# Patient Record
Sex: Male | Born: 1960 | Race: White | Hispanic: No | Marital: Married | Smoking: Never smoker
Health system: Southern US, Community
[De-identification: ages and names within clinical notes are randomized; demographics above are authoritative.]

## PROBLEM LIST (undated history)

## (undated) DIAGNOSIS — J302 Other seasonal allergic rhinitis: Secondary | ICD-10-CM

## (undated) DIAGNOSIS — N289 Disorder of kidney and ureter, unspecified: Secondary | ICD-10-CM

## (undated) HISTORY — DX: Other seasonal allergic rhinitis: J30.2

---

## 2004-04-05 ENCOUNTER — Emergency Department (HOSPITAL_COMMUNITY): Admission: EM | Admit: 2004-04-05 | Discharge: 2004-04-05 | Payer: Self-pay | Admitting: Emergency Medicine

## 2015-02-04 ENCOUNTER — Ambulatory Visit (INDEPENDENT_AMBULATORY_CARE_PROVIDER_SITE_OTHER): Payer: 59 | Admitting: Sports Medicine

## 2015-02-04 ENCOUNTER — Encounter: Payer: Self-pay | Admitting: Sports Medicine

## 2015-02-04 VITALS — BP 141/88 | HR 59 | Ht 65.0 in | Wt 140.0 lb

## 2015-02-04 DIAGNOSIS — M67952 Unspecified disorder of synovium and tendon, left thigh: Secondary | ICD-10-CM | POA: Insufficient documentation

## 2015-02-04 NOTE — Assessment & Plan Note (Signed)
This is mild so we will focus on Hip abduction series to retrain glut med  Use topical creams  OK to train  His running gait shows increased "wobble" on foot strike seen at left hip  Work his strngth for 6 to 8 weeks Reck and repeat scan and gait eval.

## 2015-02-04 NOTE — Progress Notes (Signed)
Patient ID: Tyler KellyBruce Mccoy, male   DOB: [birth year], 54 y.o.   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  Over past 3 mos has been treated for persistent left lat hip pain Felt to be buristis  Rest and cross training help NSAIDs help  STill able to train but sometimes hurts 24 to 48 hours later  He thinks it flares more on bike than w running  Exam Muscular M/ ND BP 141/88 mmHg  Pulse 59  Ht 5\' 5"  (1.651 m)  Wt 140 lb (63.504 kg)  BMI 23.30 kg/m2  Hips - Full ROM bilat LT hip some TTP along glut med most closer to ILiac crest Weakness on hip abduction on left only Other muslces all very strong Feel tenderness on lat and xover step up  US OF LT lat hip Grt troch shows no sign of bursal swelling Glut med and piriformis tendonsintact Prox glut med shows hyperechoic change 2 cms distal to iliac crest No sign of tear  No hypoechoic change Hyperechoic change on trans scan ~ 20% of glut med mm and only in proximal 2 cms

## 2015-03-24 ENCOUNTER — Encounter: Payer: Self-pay | Admitting: Sports Medicine

## 2015-03-24 ENCOUNTER — Ambulatory Visit (INDEPENDENT_AMBULATORY_CARE_PROVIDER_SITE_OTHER): Payer: 59 | Admitting: Sports Medicine

## 2015-03-24 VITALS — BP 120/82 | Ht 65.0 in | Wt 140.0 lb

## 2015-03-24 DIAGNOSIS — M67952 Unspecified disorder of synovium and tendon, left thigh: Secondary | ICD-10-CM | POA: Diagnosis not present

## 2015-03-24 NOTE — Progress Notes (Signed)
  Tyler Mccoy - 54 y.o. 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 He is training for the [employer] in [place], [place] in [place].  - Over past 3 mos has been treated for persistent left lat hip pain - This was initially felt to be bursitis - Rest and cross training help - NSAIDs also help help - He is still able to train but sometimes hurts 24 to 48 hours later - He aggravates the pain mostly when biking in larger gears with low RPMs   - He has been doing his Rx'd exercises almost daily: abduction, squats, flexion/abduction - He currently only in pain in morning while sitting.  - He  feels much better after stretching - no pain with exercise - heating does help with pain   ROS:     Negative other than that in HPI  HISTORY: Past Medical, Surgical, Social, and Family History Reviewed & Updated per EMR.  Pertinent Historical Findings include: negative  OBJECTIVE: BP 120/82 mmHg  Ht  (1.651 m)  Wt 140 lb (63.504 kg)  BMI 23.30 kg/m2  Physical Exam  Gen: NAD Resp: non-labored breathing.  Hips - Full ROM bilat LT hip with some TTP in mid-body of glut med most closer to ILiac crest Weakness on hip abduction L>R, although greatly improved.  Other muslces all very strong Stable hips with running gait   MEDICATIONS, LABS & OTHER ORDERS: Previous Medications   ASCORBIC ACID (VITAMIN C) 100 MG TABLET    Take 100 mg by mouth daily.   MULTIPLE VITAMIN (MULTIVITAMIN) CAPSULE    Take 1 capsule by mouth daily.   OMEGA-3 ACID ETHYL ESTERS (LOVAZA) 1 G CAPSULE    Take 1 g by mouth 2 (two) times daily.   Modified Medications   No medications on file   New Prescriptions   No medications on file   Discontinued Medications   No medications on file  No orders of the defined types were placed in this encounter.     ASSESSMENT & PLAN: See problem based charting & AVS for pt instructions.

## 2015-03-25 NOTE — Assessment & Plan Note (Addendum)
Doing very well after doing almost daily hip stabilizing exercises.  He has minimal pain in the morning now. - He will continue to work on hip stabilizing exercises - f/u PRN.

## 2017-09-24 ENCOUNTER — Other Ambulatory Visit: Payer: Self-pay | Admitting: Allergy and Immunology

## 2017-09-24 ENCOUNTER — Ambulatory Visit
Admission: RE | Admit: 2017-09-24 | Discharge: 2017-09-24 | Disposition: A | Discharge status: Home / Self Care | Payer: 59 | Source: Ambulatory Visit | Attending: Allergy and Immunology | Admitting: Allergy and Immunology

## 2017-09-24 DIAGNOSIS — R05 Cough: Secondary | ICD-10-CM

## 2017-09-24 DIAGNOSIS — R059 Cough, unspecified: Secondary | ICD-10-CM

## 2017-10-23 ENCOUNTER — Ambulatory Visit (INDEPENDENT_AMBULATORY_CARE_PROVIDER_SITE_OTHER): Payer: 59 | Admitting: Pulmonary Disease

## 2017-10-23 ENCOUNTER — Encounter: Payer: Self-pay | Admitting: Pulmonary Disease

## 2017-10-23 VITALS — BP 138/76 | HR 70 | Ht 64.5 in | Wt 146.0 lb

## 2017-10-23 DIAGNOSIS — R05 Cough: Secondary | ICD-10-CM | POA: Diagnosis not present

## 2017-10-23 DIAGNOSIS — R059 Cough, unspecified: Secondary | ICD-10-CM

## 2017-10-23 DIAGNOSIS — J4540 Moderate persistent asthma, uncomplicated: Secondary | ICD-10-CM

## 2017-10-23 NOTE — Patient Instructions (Signed)
For persistent cough with mucus production: We will check a lung function test and high-resolution CT scan of the chest to look for a condition called bronchiectasis  For upper airway cough Here I am referring to the cough that is exacerbated by laughing.  I think this is due to upper airway irritation and he need to try to rest your vocal cords. You need to try to suppress your cough to allow your larynx (voice box) to heal.  For three days don't talk, laugh, sing, or clear your throat. Do everything you can to suppress the cough during this time. Use hard candies (sugarless Jolly Ranchers) or non-mint or non-menthol containing cough drops during this time to soothe your throat.  Use a cough suppressant (Delsym or what I have prescribed you) around the clock during this time.  After three days, gradually increase the use of your voice and back off on the cough suppressants.  For allergic asthma: Continue Symbicort, Singulair, Zyrtec, immunotherapy as directed by Dr. Irena CordsVan Winkle Please let me know if your primary care physician checked an eosinophil count recently  We will see you back in about 2 weeks to go over the results of the lung function test and CT scan

## 2017-10-23 NOTE — Progress Notes (Signed)
Subjective:   PATIENT ID: Tyler Mccoy GENDER: male DOB: 06/23/1961, MRN: [id number]  Synopsis: Referred in 10/2017 for recurrent bronchitis, wheezing and coughing.  He has allergy induced asthma and has been treated with cedar and tree pollen immunotherapy since 03/2016 by Dr. Irena Cords.   HPI  Chief Complaint  Patient presents with  . Consult    self referral for exercise induced cough X2 mos    Tyler Mccoy is here to see me for problems with his left lung: > 3 months ago he had a cold which required antibiotics and steroids > about 2 weeks later he still had symptoms so he went back to his PCP and needed prednisone > the prednisone would help for a while but then he had symptoms again  On his annual visit with allergy he had an abnormal lung exam. A CXR was normal and he was treated with an antibiotic.  He says that exercise will continue to make him clear out mucus.  He says that if he laughs he coughs.  The mucus was dark yellow, but lately it has been clear.  He will still have a harsh cough after laughing that leads to a coughing spasm that lasts for a while.  He will still have a "gurgle" in his voice and hears mucus in his throat.  If he lay on his left side he will start to wheeze.   Two years ago cleaned crawl space and was cleaning out mold with a bleach-containing solution.  He says that he did this over the course of 2 hours and had to leave the crawl space several times because the fumes became overwhelming for him.  This preceded his diagnosis of asthma.  He has allergy induced asthma and he takes Zyrtec daily, symbicort and Symbicort.  Dr. Irena Cords increased the dose to 160 a few weeks back.  This has helped with his wheezing.  He is now taking singulair.     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  He had pollen allergies.  He has been treated with immunotherapy which is helping his sinus congestion.  He doesn't have any sinus  congestion right now.    He has no family members with lung problems other than a son with asthma.    Past Medical History:  Diagnosis Date  . Seasonal allergies      Family History  Problem Relation Age of Onset  . Asthma Son      Social History   Socioeconomic History  . Marital status: Married    Spouse name: Not on file  . Number of children: Not on file  . Years of education: Not on file  . Highest education level: Not on file  Social Needs  . Financial resource strain: Not on file  . Food insecurity - worry: Not on file  . Food insecurity - inability: Not on file  . Transportation needs - medical: Not on file  . Transportation needs - non-medical: Not on file  Occupational History  . Not on file  Tobacco Use  . Smoking status: Never Smoker  . Smokeless tobacco: Never Used  Substance and Sexual Activity  . Alcohol use: Not on file  . Drug use: Not on file  . Sexual activity: Not on file  Other Topics Concern  . Not on file  Social History Narrative  . Not on file     No Known Allergies   Outpatient Medications  Prior to Visit  Medication Sig Dispense Refill  . albuterol (PROVENTIL HFA;VENTOLIN HFA) 108 (90 Base) MCG/ACT inhaler Inhale 1-2 puffs into the lungs every 6 (six) hours as needed for wheezing or shortness of breath.    . Ascorbic Acid (VITAMIN C) 100 MG tablet Take 100 mg by mouth daily.    . budesonide-formoterol (SYMBICORT) 160-4.5 MCG/ACT inhaler Inhale 2 puffs into the lungs 2 (two) times daily.    . cetirizine (ZYRTEC) 10 MG tablet Take 10 mg by mouth daily.    . Multiple Vitamin (MULTIVITAMIN) capsule Take 1 capsule by mouth daily.    Marland Kitchen. omega-3 acid ethyl esters (LOVAZA) 1 G capsule Take 1 g by mouth 2 (two) times daily.     No facility-administered medications prior to visit.     Review of Systems  Constitutional: Negative for fever and weight loss.  HENT: Positive for congestion. Negative for ear pain, nosebleeds and sore throat.     Eyes: Negative for redness.  Respiratory: Positive for cough and wheezing. Negative for shortness of breath.   Cardiovascular: Negative for palpitations, leg swelling and PND.  Gastrointestinal: Negative for nausea and vomiting.  Genitourinary: Negative for dysuria.  Skin: Negative for rash.  Neurological: Negative for headaches.  Endo/Heme/Allergies: Does not bruise/bleed easily.  Psychiatric/Behavioral: Negative for depression. The patient is not nervous/anxious.       Objective:  Physical Exam   Vitals:   10/23/17 0942  BP: 138/76  Pulse: 70  SpO2: 97%  Weight: 146 lb (66.2 kg)  Height: 5' 4.5" (1.638 m)    Gen: well appearing, no acute distress HENT: NCAT, OP clear, neck supple without masses Eyes: PERRL, EOMi Lymph: no cervical lymphadenopathy PULM: Few crackles anteriorly LUL B CV: RRR, no mgr, no JVD GI: BS+, soft, nontender, no hsm Derm: no rash or skin breakdown MSK: normal bulk and tone Neuro: A&Ox4, CN II-XII intact, strength 5/5 in all 4 extremities Psyche: normal mood and affect   CBC No results found for: WBC, RBC, HGB, HCT, PLT, MCV, MCH, MCHC, RDW, LYMPHSABS, MONOABS, EOSABS, BASOSABS   Chest imaging:  PFT:  Labs:  Path:  Echo:  Heart Catheterization:       Assessment & Plan:   Moderate persistent extrinsic asthma without complication  Cough - Plan: CT Chest High Resolution, Pulmonary function test  Discussion: Emersen comes to see me today for a persistent cough and mucus production after an episode of bronchitis.  He has baseline allergic asthma.  I do question whether or not he could have had some component of reactive airways disease as he describes significant bleach exposure 2 years ago preceding his diagnosis of asthma.  That being said, I think the management of his asthma has been appropriate up until this point.  I like to know if his serum eosinophil count has been elevated recently so he is going to check in this for  me.  I think about his problem in 2 ways.  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  I think with voice rest we can help his upper airway heal and reduce the repetitive/cyclical cough.  Second, he is producing mucus he exercises.  Given the recurrent episode of left-sided chest discomfort I worry about focal bronchiectasis.  Plan: For persistent cough with mucus production: We will check a lung function test and high-resolution CT scan of the chest to look  for a condition called bronchiectasis  For upper airway cough Here I am referring to the cough that is exacerbated by laughing.  I think this is due to upper airway irritation and he need to try to rest your vocal cords. You need to try to suppress your cough to allow your larynx (voice box) to heal.  For three days don't talk, laugh, sing, or clear your throat. Do everything you can to suppress the cough during this time. Use hard candies (sugarless Jolly Ranchers) or non-mint or non-menthol containing cough drops during this time to soothe your throat.  Use a cough suppressant (Delsym or what I have prescribed you) around the clock during this time.  After three days, gradually increase the use of your voice and back off on the cough suppressants.  For allergic asthma: Continue Symbicort, Singulair, Zyrtec, immunotherapy as directed by Dr. Irena Cords Please let me know if your primary care physician checked an eosinophil count recently  We will see you back in about 2 weeks to go over the results of the lung function test and CT scan   Current Outpatient Medications:  .  albuterol (PROVENTIL HFA;VENTOLIN HFA) 108 (90 Base) MCG/ACT inhaler, Inhale 1-2 puffs into the lungs every 6 (six) hours as needed for wheezing or shortness of breath., Disp: , Rfl:  .  Ascorbic Acid (VITAMIN C) 100 MG tablet, Take 100 mg by mouth daily.,  Disp: , Rfl:  .  budesonide-formoterol (SYMBICORT) 160-4.5 MCG/ACT inhaler, Inhale 2 puffs into the lungs 2 (two) times daily., Disp: , Rfl:  .  cetirizine (ZYRTEC) 10 MG tablet, Take 10 mg by mouth daily., Disp: , Rfl:  .  Multiple Vitamin (MULTIVITAMIN) capsule, Take 1 capsule by mouth daily., Disp: , Rfl:  .  omega-3 acid ethyl esters (LOVAZA) 1 G capsule, Take 1 g by mouth 2 (two) times daily., Disp: , Rfl:

## 2017-10-25 ENCOUNTER — Ambulatory Visit (INDEPENDENT_AMBULATORY_CARE_PROVIDER_SITE_OTHER)
Admission: RE | Admit: 2017-10-25 | Discharge: 2017-10-25 | Disposition: A | Discharge status: Home / Self Care | Payer: 59 | Source: Ambulatory Visit | Attending: Pulmonary Disease | Admitting: Pulmonary Disease

## 2017-10-25 ENCOUNTER — Telehealth: Payer: Self-pay | Admitting: Pulmonary Disease

## 2017-10-25 ENCOUNTER — Inpatient Hospital Stay: Admission: RE | Admit: 2017-10-25 | Payer: 59 | Source: Ambulatory Visit

## 2017-10-25 DIAGNOSIS — J4540 Moderate persistent asthma, uncomplicated: Secondary | ICD-10-CM

## 2017-10-25 DIAGNOSIS — R059 Cough, unspecified: Secondary | ICD-10-CM

## 2017-10-25 DIAGNOSIS — R05 Cough: Secondary | ICD-10-CM

## 2017-10-25 NOTE — Telephone Encounter (Signed)
Working on a appeal Tobe SosSally E Ottinger

## 2017-10-25 NOTE — Telephone Encounter (Signed)
Sp[facility] with [facility] who stated she had just spoken with pt about the CT scan.  [facility] to our office stating why the CT scan was denied, and it looked like pt needed a current cxr.  Almyra FreeLibby was able to call pt back to see if he could come for a cxr and pt stated to her he could.  Order has been placed for cxr to be done so that way we can see if we can get ct scan approved.  Will route this message to [facility] for her to follow-up on regarding status of approval for pt's CT.

## 2017-10-31 NOTE — Telephone Encounter (Signed)
Patient had a cxr on 3/21 and results have been relayed to patient.  Libby please advise if this helps this appeal process for the CT.  Thanks!

## 2017-10-31 NOTE — Telephone Encounter (Signed)
PCCs, please advise if anything else is needed for this message. Thanks!

## 2017-11-06 NOTE — Telephone Encounter (Signed)
ATC pt, no answer. Left message for pt to call back with intepreter.

## 2017-11-06 NOTE — Telephone Encounter (Signed)
[facility]ed [facility] and made an appt for 11/08/2017 at 12:30 for peer to peer.

## 2017-11-06 NOTE — Telephone Encounter (Signed)
Can you call the peer to peer line and find out if I need to schedule it?  Lately when I call they tell me that I can talk to a physician at somepoint in the future.

## 2017-11-06 NOTE — Telephone Encounter (Signed)
Well I went in the ins website and sent more records and they have now denied the reconsideration so this pt can no have this chest cone now if dr Kendrick Friesmcquaid wants to call and try a peer to peer case# that was denied [id number] # [phone] thanks libby

## 2017-11-08 ENCOUNTER — Ambulatory Visit (INDEPENDENT_AMBULATORY_CARE_PROVIDER_SITE_OTHER): Payer: 59 | Admitting: Pulmonary Disease

## 2017-11-08 DIAGNOSIS — R05 Cough: Secondary | ICD-10-CM

## 2017-11-08 DIAGNOSIS — R059 Cough, unspecified: Secondary | ICD-10-CM

## 2017-11-08 LAB — PULMONARY FUNCTION TEST
DL/VA % pred: 134 %
DL/VA: 5.74 ml/min/mmHg/L
DLCO UNC % PRED: 118 %
DLCO unc: 30.29 ml/min/mmHg
FEF 25-75 Post: 2.32 L/sec
FEF 25-75 Pre: 2 L/sec
FEF2575-%CHANGE-POST: 15 %
FEF2575-%PRED-POST: 86 %
FEF2575-%PRED-PRE: 74 %
FEV1-%Change-Post: 4 %
FEV1-%PRED-POST: 80 %
FEV1-%Pred-Pre: 76 %
FEV1-POST: 2.47 L
FEV1-Pre: 2.36 L
FEV1FVC-%CHANGE-POST: 0 %
FEV1FVC-%Pred-Pre: 101 %
FEV6-%CHANGE-POST: 1 %
FEV6-%PRED-POST: 79 %
FEV6-%Pred-Pre: 77 %
FEV6-Post: 3.06 L
FEV6-Pre: 3.01 L
FEV6FVC-%CHANGE-POST: 0 %
FEV6FVC-%PRED-PRE: 104 %
FEV6FVC-%Pred-Post: 105 %
FVC-%CHANGE-POST: 4 %
FVC-%Pred-Post: 79 %
FVC-%Pred-Pre: 75 %
FVC-Post: 3.2 L
FVC-Pre: 3.07 L
POST FEV1/FVC RATIO: 77 %
POST FEV6/FVC RATIO: 100 %
PRE FEV1/FVC RATIO: 77 %
Pre FEV6/FVC Ratio: 100 %
RV % PRED: 125 %
RV: 2.39 L
TLC % pred: 100 %
TLC: 6.01 L

## 2017-11-08 NOTE — Progress Notes (Signed)
PFT completed today 11/08/17  

## 2017-11-09 ENCOUNTER — Telehealth: Payer: Self-pay | Admitting: Pulmonary Disease

## 2017-11-09 NOTE — Telephone Encounter (Signed)
Did this peer to peer get done yesterday?

## 2017-11-09 NOTE — Telephone Encounter (Signed)
Called patient back and gave results per Dr. Ulyses JarredMcQuaid's request in result note. See below note.  Notes recorded by Sylvester HarderMoore, Heather R, LPN on 1/6/10964/11/2017 at 5:10 PM EDT Left message for patient will call back. ------  Notes recorded by Lupita LeashMcQuaid, Douglas B, MD on 11/08/2017 at 4:52 PM EDT A, Please let the patient know this was OK Thanks, B    Nothing further needed at this time.

## 2017-11-09 NOTE — Telephone Encounter (Signed)
LVM for patient to return call, just updating pt that [facility] was to call yesterday 11/08/17 for peer to peer. X1 Spoke with Morrie Sheldonshley to see if peer to peer was complete; looks like not completed Sp[facility] with [facility] letting [facility] know peer to peer was not completed that we can see in the chart;  Almyra FreeLibby advised that she routing message to BQ for review

## 2017-11-09 NOTE — Telephone Encounter (Signed)
Didn't receive a call

## 2017-11-12 ENCOUNTER — Ambulatory Visit (INDEPENDENT_AMBULATORY_CARE_PROVIDER_SITE_OTHER)
Admission: RE | Admit: 2017-11-12 | Discharge: 2017-11-12 | Disposition: A | Discharge status: Home / Self Care | Payer: 59 | Source: Ambulatory Visit | Attending: Pulmonary Disease | Admitting: Pulmonary Disease

## 2017-11-12 DIAGNOSIS — R059 Cough, unspecified: Secondary | ICD-10-CM

## 2017-11-12 DIAGNOSIS — R05 Cough: Secondary | ICD-10-CM | POA: Diagnosis not present

## 2017-11-12 NOTE — Telephone Encounter (Signed)
Dr Kendrick Friesmcquaid did call and sprak to a md and go the ct authorized Tyler Mccoy

## 2017-11-15 ENCOUNTER — Encounter: Payer: Self-pay | Admitting: Pulmonary Disease

## 2017-11-15 ENCOUNTER — Ambulatory Visit (INDEPENDENT_AMBULATORY_CARE_PROVIDER_SITE_OTHER): Payer: 59 | Admitting: Pulmonary Disease

## 2017-11-15 VITALS — BP 120/86 | HR 87 | Ht 65.0 in | Wt 146.0 lb

## 2017-11-15 DIAGNOSIS — J300 Vasomotor rhinitis: Secondary | ICD-10-CM | POA: Diagnosis not present

## 2017-11-15 DIAGNOSIS — J4540 Moderate persistent asthma, uncomplicated: Secondary | ICD-10-CM | POA: Diagnosis not present

## 2017-11-15 DIAGNOSIS — R911 Solitary pulmonary nodule: Secondary | ICD-10-CM | POA: Diagnosis not present

## 2017-11-15 DIAGNOSIS — R059 Cough, unspecified: Secondary | ICD-10-CM

## 2017-11-15 DIAGNOSIS — R05 Cough: Secondary | ICD-10-CM | POA: Diagnosis not present

## 2017-11-15 MED ORDER — FLUTTER DEVI: 0 refills | Status: DC

## 2017-11-15 MED ORDER — IPRATROPIUM BROMIDE 0.06 % NA SOLN: 2.0000 | Freq: Four times a day (QID) | NASAL | 12 refills | Status: DC | PRN

## 2017-11-15 NOTE — Patient Instructions (Signed)
Severe persistent asthma: Continue Symbicort 2 puffs twice a day Use albuterol as needed for chest tightness wheezing or shortness of breath Use the flutter valve 10 breaths at a time 4-5 times a day on days when you have increasing chest congestion You may also use the flutter valve on a routine basis about once a week to help open your lungs up  Vasomotor rhinitis: We will prescribe a medicine called ipratropium nasal spray which you can use 2 sprays each nostril every 4-6 hours as needed for the runny nose.  I think you should take this prior to meals and about 10-15 minutes prior to exercise  Pulmonary nodule: Given the fact that this is small, round, and you are a lifelong non-smoker this does not need further imaging  We will see you back in about 4-5 months around ragweed season.  If things are stable at that point then we can decrease the dose of Symbicort and you can follow-up with Dr. Debbora PrestoVon Winkel afterwards

## 2017-11-15 NOTE — Progress Notes (Signed)
Subjective:   PATIENT ID: Tyler Mccoy GENDER: male DOB: 07-16-1961, MRN: [id number]  Synopsis: Referred in 10/2017 for recurrent bronchitis, wheezing and coughing.  He has allergy induced asthma and has been treated with cedar and tree pollen immunotherapy since 03/2016 by Dr. Irena Cords.   HPI  Chief Complaint  Patient presents with  . Follow-up    Pft, Ct, SOB with alot of activity little bit of cough white mucus.   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  He continues to take Symbicort 2 puffs twice a day.  He says that he continues to have some mild cough and throat irritation but it is greatly improved.  He does continue to have a clear mucus production whenever he eats and when he gets on the bike.  He is compliant with his allergy medicines which includes Flonase and Symbicort  Past Medical History:  Diagnosis Date  . Seasonal allergies       Review of Systems  Constitutional: Negative for fever and weight loss.  HENT: Positive for congestion. Negative for ear pain, nosebleeds and sore throat.   Eyes: Negative for redness.  Respiratory: Positive for cough and wheezing. Negative for shortness of breath.   Cardiovascular: Negative for palpitations, leg swelling and PND.  Gastrointestinal: Negative for nausea and vomiting.  Genitourinary: Negative for dysuria.  Skin: Negative for rash.  Neurological: Negative for headaches.  Endo/Heme/Allergies: Does not bruise/bleed easily.  Psychiatric/Behavioral: Negative for depression. The patient is not nervous/anxious.       Objective:  Physical Exam   Vitals:   11/15/17 0947  BP: 120/86  Pulse: 87  SpO2: 93%  Weight: 146 lb (66.2 kg)  Height: 5\' 5"  (1.651 m)    Gen: well appearing HENT: OP clear, TM's clear, neck  supple PULM: CTA B, normal percussion CV: RRR, no mgr, trace edema GI: BS+, soft, nontender Derm: no cyanosis or rash Psyche: normal mood and affect   CBC No results found for: WBC, RBC, HGB, HCT, PLT, MCV, MCH, MCHC, RDW, LYMPHSABS, MONOABS, EOSABS, BASOSABS   Chest imaging: April 2019 CT chest images independently reviewed showing small pulmonary nodules, the largest 4.7 mm, some mucus plugging in the lingula, nonspecific atelectatic type change in the right middle lobe.  PFT: April 2019 some small airways obstruction that improved with bronchodilator FEV1 2.47 L 80% predicted, FVC 3.20 L 79% predicted, total lung capacity 6.01 L 100% predicted, DLCO 30.3 mL 118% predicted  Labs:  Path:  Echo:  Heart Catheterization:       Assessment & Plan:   Cough  Moderate persistent extrinsic asthma without complication  Vasomotor rhinitis  Pulmonary nodule  Discussion: [name] has been doing better since the last visit.  The CT scan of his chest did not show evidence of bronchiectasis though he had some mucus plugging and nodules in the right middle lobe and lingula consistent with significant coughing from prior respiratory infections.  I am pleased that there is no evidence of bronchiectasis.  I believe that the mucus plugging is related to his asthma and prior respiratory infections.  I think that it would be helpful for him to use a flutter valve occasionally to help clear mucus out and improve his symptoms of chest tightness.  In  regards to the pulmonary nodule is quite small and as a lifelong non-smoker there is no need to repeat lung imaging at this time.  He does have gustatory or vasomotor rhinitis.  I think this would be greatly helped with ipratropium nasal spray.  Plan: Severe persistent asthma: Continue Symbicort 2 puffs twice a day Use albuterol as needed for chest tightness wheezing or shortness of breath Use the flutter valve 10 breaths at a time 4-5 times a day on  days when you have increasing chest congestion You may also use the flutter valve on a routine basis about once a week to help open your lungs up  Vasomotor rhinitis: We will prescribe a medicine called ipratropium nasal spray which you can use 2 sprays each nostril every 4-6 hours as needed for the runny nose.  I think you should take this prior to meals and about 10-15 minutes prior to exercise  Pulmonary nodule: Given the fact that this is small, round, and you are a lifelong non-smoker this does not need further imaging  We will see you back in about 4-5 months around ragweed season.  If things are stable at that point then we can decrease the dose of Symbicort and you can follow-up with Dr. Debbora PrestoVon Winkel afterwards   Current Outpatient Medications:  .  albuterol (PROVENTIL HFA;VENTOLIN HFA) 108 (90 Base) MCG/ACT inhaler, Inhale 1-2 puffs into the lungs every 6 (six) hours as needed for wheezing or shortness of breath., Disp: , Rfl:  .  Ascorbic Acid (VITAMIN C) 100 MG tablet, Take 1,000 mg by mouth daily. , Disp: , Rfl:  .  budesonide-formoterol (SYMBICORT) 160-4.5 MCG/ACT inhaler, Inhale 2 puffs into the lungs 2 (two) times daily., Disp: , Rfl:  .  cetirizine (ZYRTEC) 10 MG tablet, Take 10 mg by mouth daily., Disp: , Rfl:  .  Multiple Vitamin (MULTIVITAMIN) capsule, Take 1 capsule by mouth daily., Disp: , Rfl:  .  omega-3 acid ethyl esters (LOVAZA) 1 G capsule, Take 1 g by mouth 2 (two) times daily., Disp: , Rfl:  .  ipratropium (ATROVENT) 0.06 % nasal spray, Place 2 sprays into both nostrils 4 (four) times daily as needed for rhinitis., Disp: 15 mL, Rfl: 12 .  Respiratory Therapy Supplies (FLUTTER) DEVI, Use as directed., Disp: 1 each, Rfl: 0

## 2018-03-27 ENCOUNTER — Encounter: Payer: Self-pay | Admitting: Pulmonary Disease

## 2018-03-27 ENCOUNTER — Ambulatory Visit (INDEPENDENT_AMBULATORY_CARE_PROVIDER_SITE_OTHER): Payer: 59 | Admitting: Pulmonary Disease

## 2018-03-27 VITALS — BP 118/80 | HR 84 | Ht 64.17 in | Wt 141.8 lb

## 2018-03-27 DIAGNOSIS — K2100 Gastro-esophageal reflux disease with esophagitis, without bleeding: Secondary | ICD-10-CM

## 2018-03-27 DIAGNOSIS — Z23 Encounter for immunization: Secondary | ICD-10-CM

## 2018-03-27 DIAGNOSIS — J301 Allergic rhinitis due to pollen: Secondary | ICD-10-CM | POA: Diagnosis not present

## 2018-03-27 DIAGNOSIS — J4520 Mild intermittent asthma, uncomplicated: Secondary | ICD-10-CM

## 2018-03-27 NOTE — Patient Instructions (Signed)
Allergic rhinitis: Continue taking immunotherapy as you are doing  Mild intermittent asthma: Get a flu shot Practice good hand hygiene Use albuterol as needed for chest tightness wheezing or shortness of breath  Gastroesophageal reflux disease: Continue to practice lifestyle modification changes I think it is not unreasonable to consider stopping ranitidine if he can control your symptoms with lifestyle modification  We will see you back on an as-needed basis

## 2018-03-27 NOTE — Progress Notes (Signed)
Subjective:   PATIENT ID: Tyler Mccoy GENDER: male DOB: April 04, 1961, MRN: [id number]  Synopsis: Referred in 10/2017 for recurrent bronchitis, wheezing and coughing.  He has allergy induced asthma and has been treated with cedar and tree pollen immunotherapy since 03/2016 by Tyler Mccoy.   HPI  Chief Complaint  Patient presents with  . Follow-up   He was recently diagnosed with GERD after a visit .  He says that he has been taking ranitidine.  Since doing this his cough and mucus production have significantly improved.  He stopped taking Symbicort about 2 weeks ago because he says he did not think that he needed it.  He says that he has not felt any more short of breath since having stopped the Symbicort.  He needs to use albuterol from time to time.  He says no more than about once every 2 weeks.  This typically occurs after heavy exercise routine.  He never has acid reflux symptoms, he thinks that the acid reflux was predominantly causing cough and mucus production.  Past Medical History:  Diagnosis Date  . Seasonal allergies       Review of Systems  Constitutional: Negative for fever and weight loss.  HENT: Positive for congestion. Negative for ear pain, nosebleeds and sore throat.   Eyes: Negative for redness.  Respiratory: Positive for cough and wheezing. Negative for shortness of breath.   Cardiovascular: Negative for palpitations, leg swelling and PND.  Gastrointestinal: Negative for nausea and vomiting.  Genitourinary: Negative for dysuria.  Skin: Negative for rash.  Neurological: Negative for headaches.  Endo/Heme/Allergies: Does not bruise/bleed easily.  Psychiatric/Behavioral: Negative for depression. The patient is not nervous/anxious.       Objective:  Physical Exam   Vitals:   03/27/18 0922  BP: 118/80  Pulse: 84  SpO2: 97%  Weight: 141 lb 12.8 oz (64.3 kg)  Height: 5' 4.17" (1.63 m)    Gen: well appearing HENT: OP clear, TM's clear, neck  supple PULM: CTA B, normal percussion CV: RRR, no mgr, trace edema GI: BS+, soft, nontender Derm: no cyanosis or rash Psyche: normal mood and affect    CBC No results found for: WBC, RBC, HGB, HCT, PLT, MCV, MCH, MCHC, RDW, LYMPHSABS, MONOABS, EOSABS, BASOSABS   Chest imaging: April 2019 CT chest images independently reviewed showing small pulmonary nodules, the largest 4.7 mm, some mucus plugging in the lingula, nonspecific atelectatic type change in the right middle lobe.  PFT: April 2019 some small airways obstruction that improved with bronchodilator FEV1 2.47 L 80% predicted, FVC 3.20 L 79% predicted, total lung capacity 6.01 L 100% predicted, DLCO 30.3 mL 118% predicted  Labs:  Path:  Echo:  Heart Catheterization:       Assessment & Plan:   Mild intermittent asthma without complication  Gastroesophageal reflux disease with esophagitis  Seasonal allergic rhinitis due to pollen  Discussion: This has been a stable interval for Tyler Mccoy.  I believe that he has mild intermittent asthma which was exacerbated by his allergic rhinitis as well as gastroesophageal reflux disease.  His lungs are clear to auscultation today he has minimal symptoms now so I think is very reasonable for him to continue to stay off of a controller inhaler.  I do think it is important for him to continue treating his acid reflux as he is doing with at a minimum lifestyle modification changes and to continue taking immunotherapy with Dr. Elta Guadeloupevon Mccoy.  Plan: Allergic rhinitis: Continue taking immunotherapy as  you are doing  Mild intermittent asthma: Get a flu shot Practice good hand hygiene Use albuterol as needed for chest tightness wheezing or shortness of breath  Gastroesophageal reflux disease: Continue to practice lifestyle modification changes I think it is not unreasonable to consider stopping ranitidine if he can control your symptoms with lifestyle modification  We will see you back on  an as-needed basis   Current Outpatient Medications:  .  albuterol (PROVENTIL HFA;VENTOLIN HFA) 108 (90 Base) MCG/ACT inhaler, Inhale 1-2 puffs into the lungs every 6 (six) hours as needed for wheezing or shortness of breath., Disp: , Rfl:  .  Ascorbic Acid (VITAMIN C) 100 MG tablet, Take 1,000 mg by mouth daily. , Disp: , Rfl:  .  cetirizine (ZYRTEC) 10 MG tablet, Take 10 mg by mouth daily., Disp: , Rfl:  .  ipratropium (ATROVENT) 0.06 % nasal spray, Place 2 sprays into both nostrils 4 (four) times daily as needed for rhinitis., Disp: 15 mL, Rfl: 12 .  Multiple Vitamin (MULTIVITAMIN) capsule, Take 1 capsule by mouth daily., Disp: , Rfl:  .  omega-3 acid ethyl esters (LOVAZA) 1 G capsule, Take 1 g by mouth 2 (two) times daily., Disp: , Rfl:  .  ranitidine (ZANTAC) 150 MG tablet, Take 150 mg by mouth at bedtime., Disp: , Rfl: 12 .  Respiratory Therapy Supplies (FLUTTER) DEVI, Use as directed., Disp: 1 each, Rfl: 0 .  budesonide-formoterol (SYMBICORT) 160-4.5 MCG/ACT inhaler, Inhale 2 puffs into the lungs 2 (two) times daily., Disp: , Rfl:

## 2019-08-17 IMAGING — DX DG CHEST 2V
2 series · 2 of 2 positions shown · non-contrast
Comparison: PA and lateral chest 09/24/2017 and 08/15/2016.

CLINICAL DATA: Cough and wheezing for 3 weeks.

EXAM:
CHEST - 2 VIEW

[chest pa]
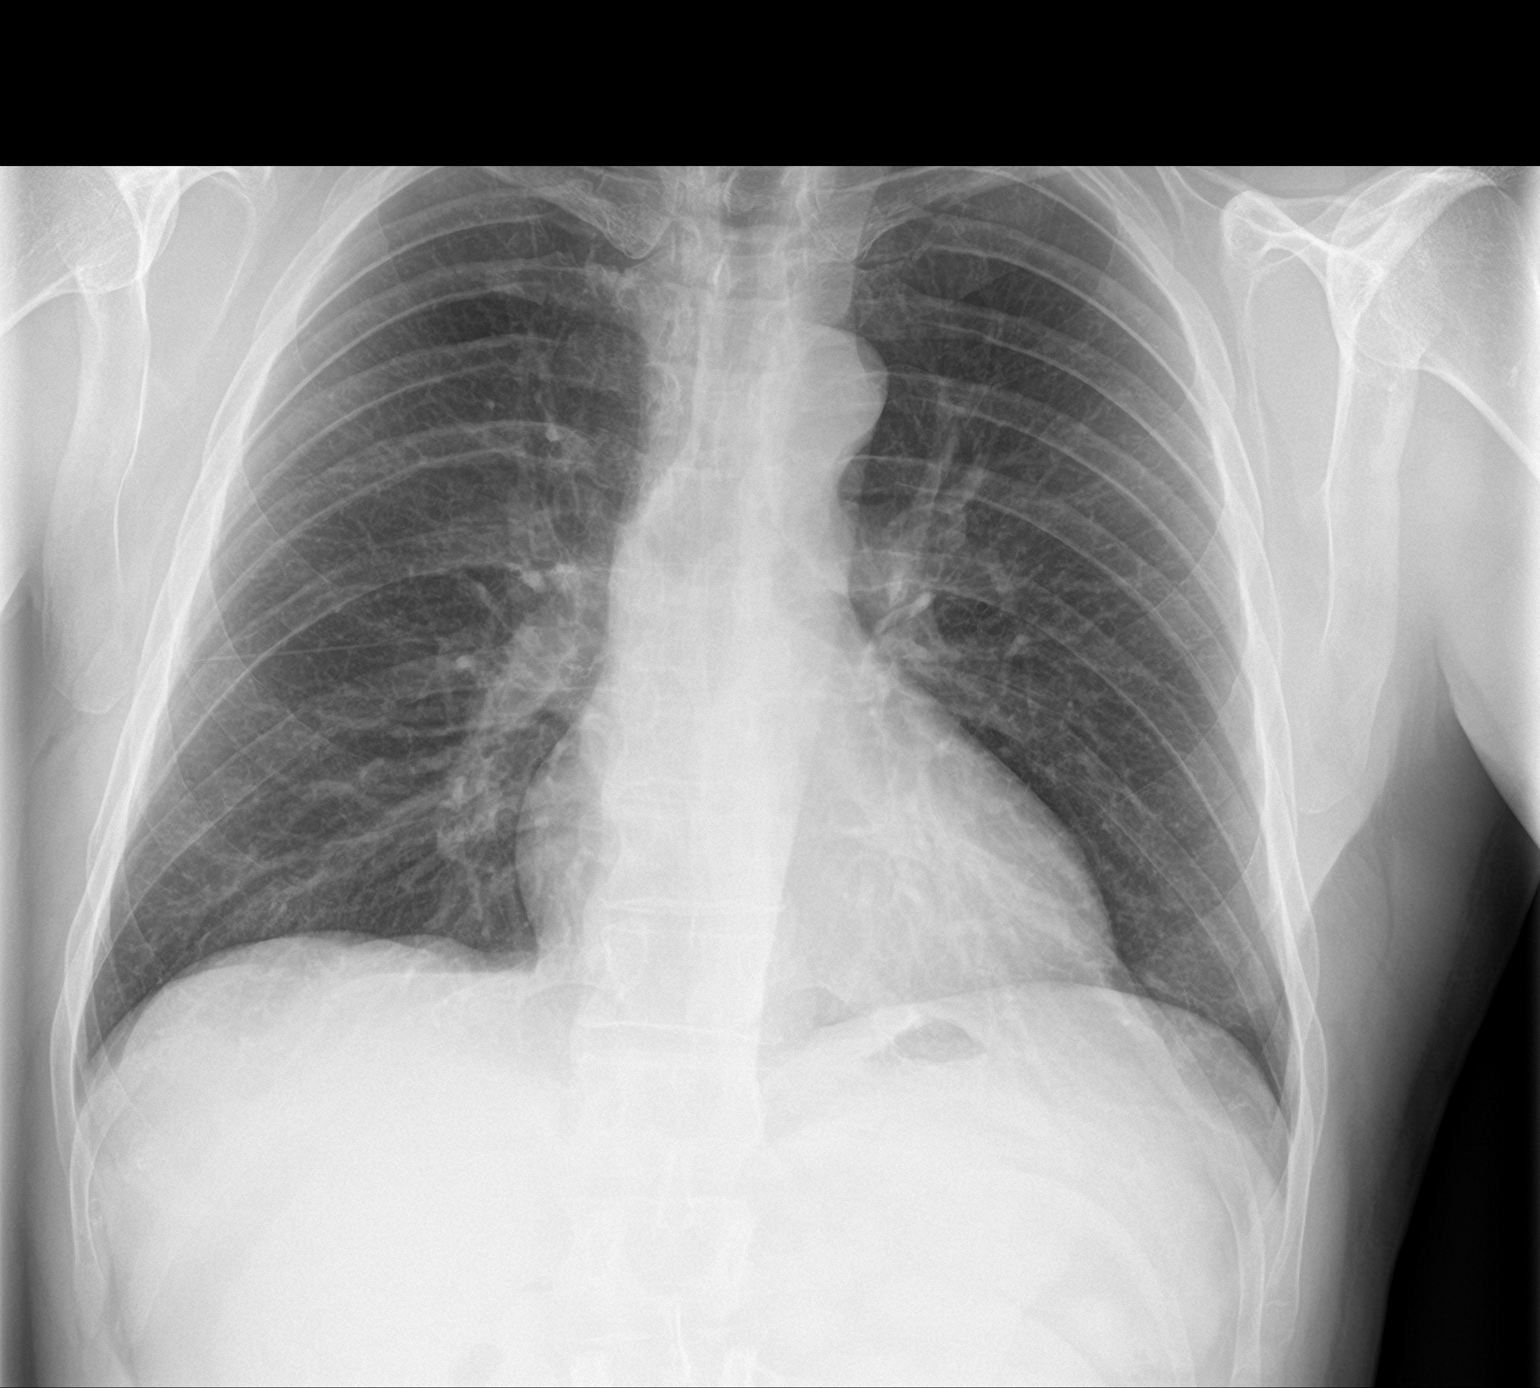

[chest lat]
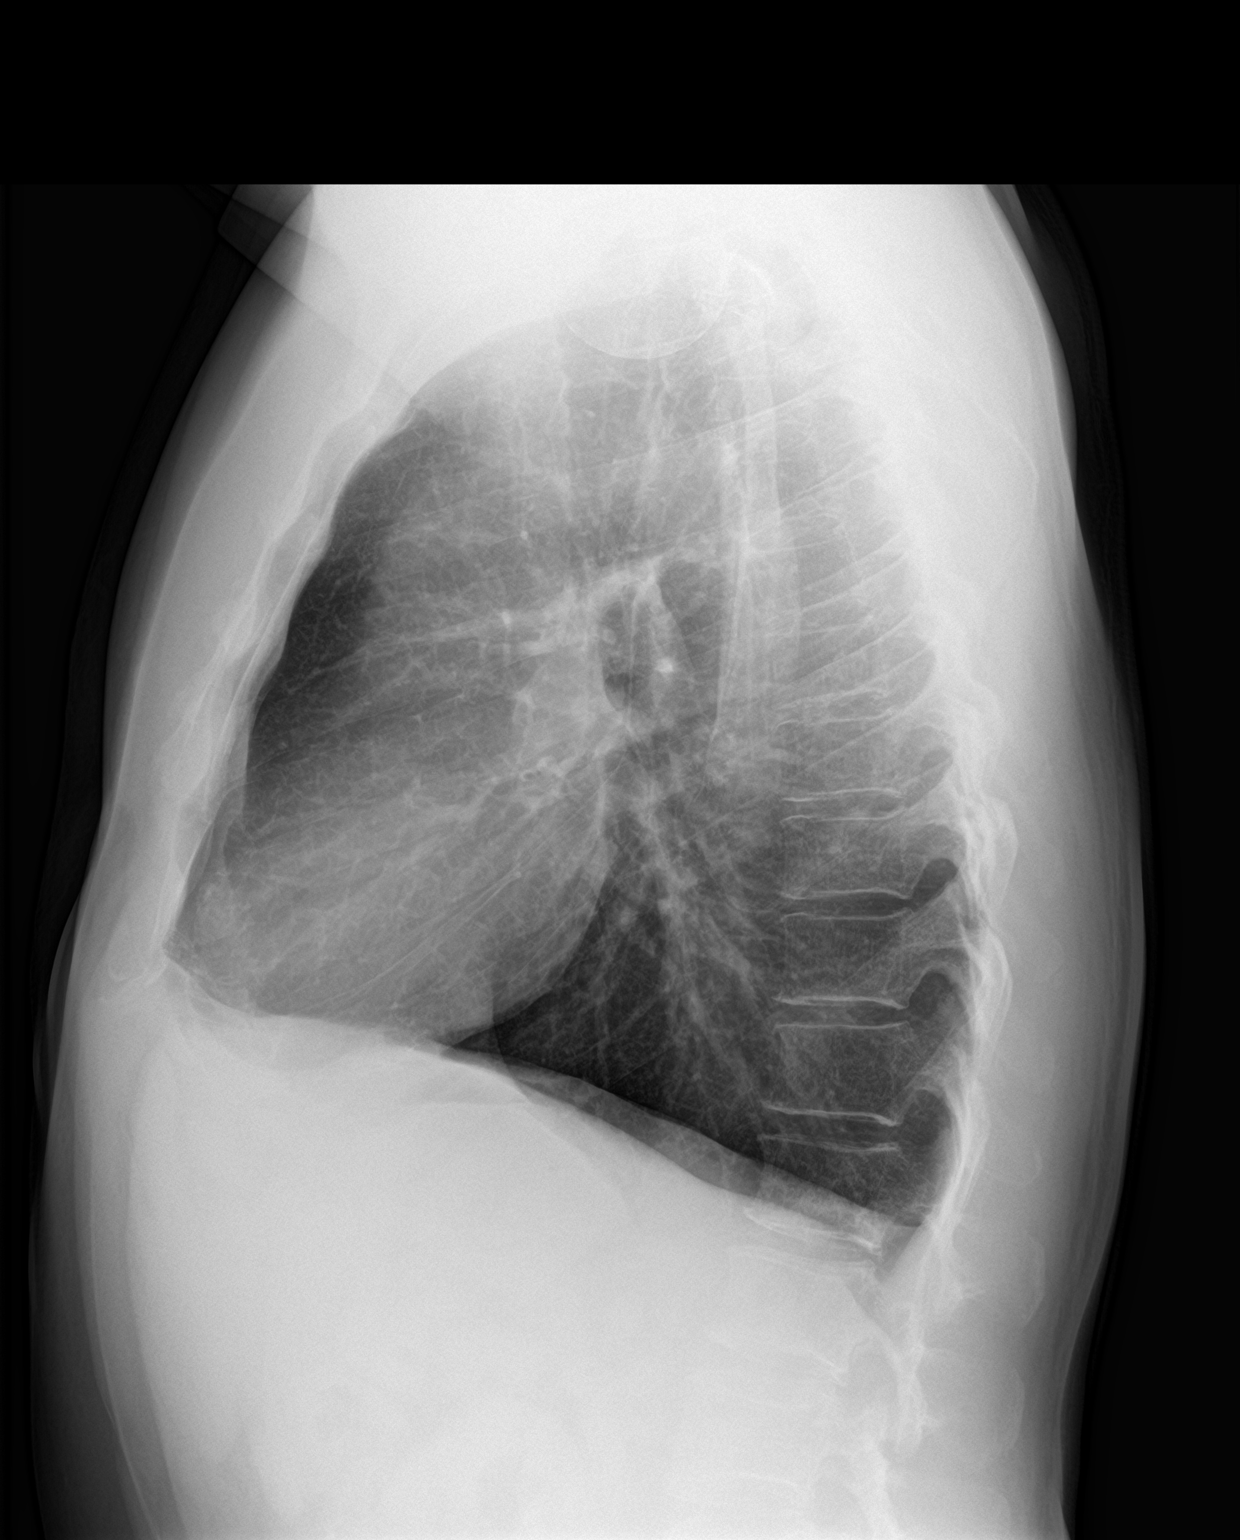

[2 of 2 positions shown; findings below may reference images not displayed]

FINDINGS: Lungs are clear. Heart size is normal. No pneumothorax or pleural
fluid. No bony abnormality.
IMPRESSION: Negative chest.

## 2019-09-04 IMAGING — CT CT CHEST HIGH RESOLUTION W/O CM
2 of 5 series · 15 of 36 positions shown, 18 images · non-contrast
Comparison: None.

CLINICAL DATA: Productive cough for 3 months. Occasional wheezing.
Shortness of breath with exertion. Bleach exposure.

EXAM:
CT CHEST WITHOUT CONTRAST
TECHNIQUE: Multidetector CT imaging of the chest was performed following the
standard protocol without intravenous contrast. High resolution
imaging of the lungs, as well as inspiratory and expiratory imaging,
was performed.

[Series 2: high resolution · axial · 0.66mm/px · z∈[-293,-25]mm · 12 of 148 slices shown, 15 images]
[im 7/148  mediastinal]
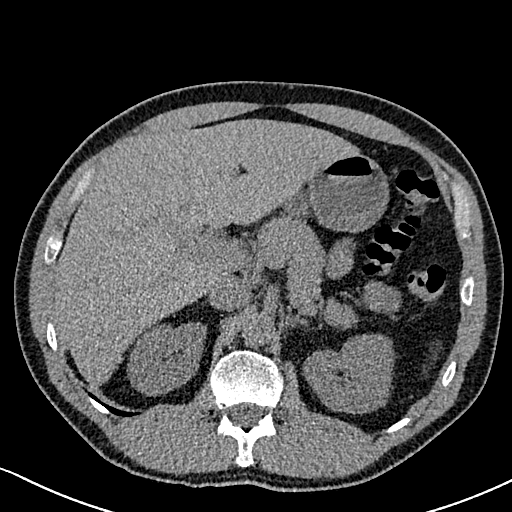
[im 7/148  lung]
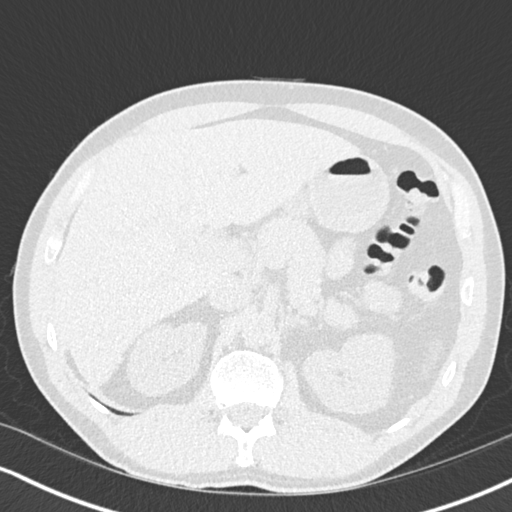
[im 21/148  lung]
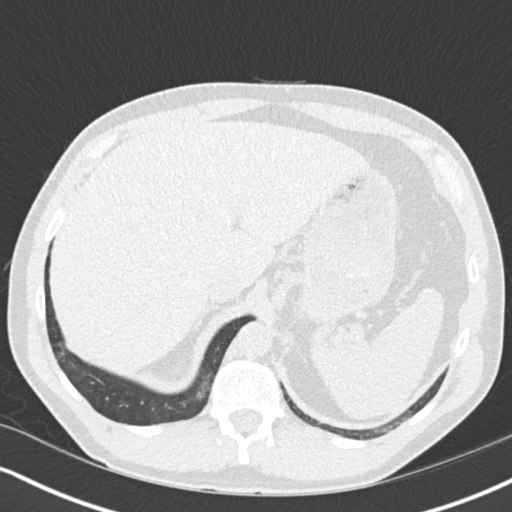
[im 34/148  lung]
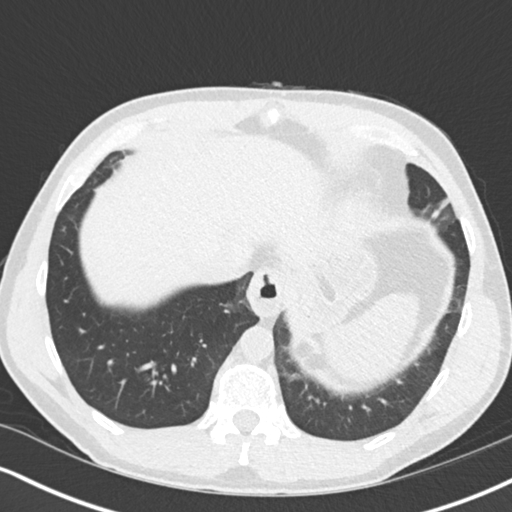
[im 47/148  lung]
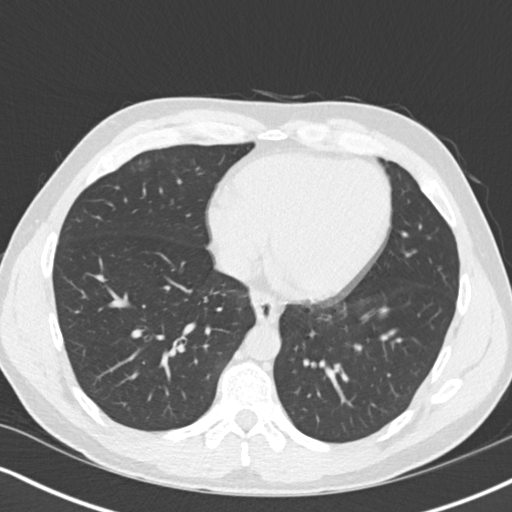
[im 54/148  mediastinal]
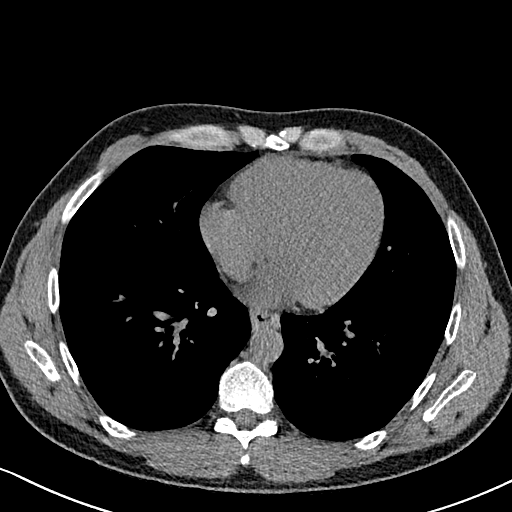
[im 54/148  lung]
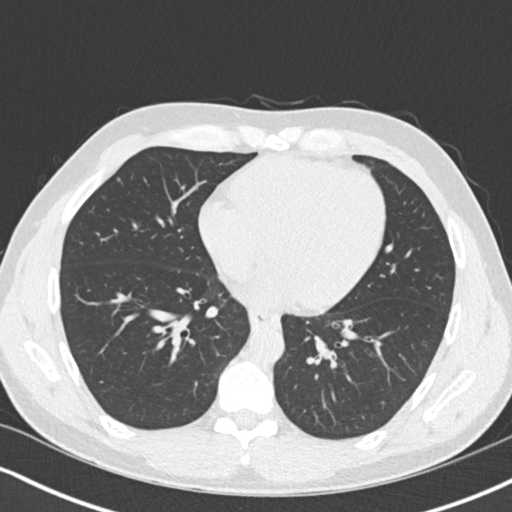
[im 67/148  lung]
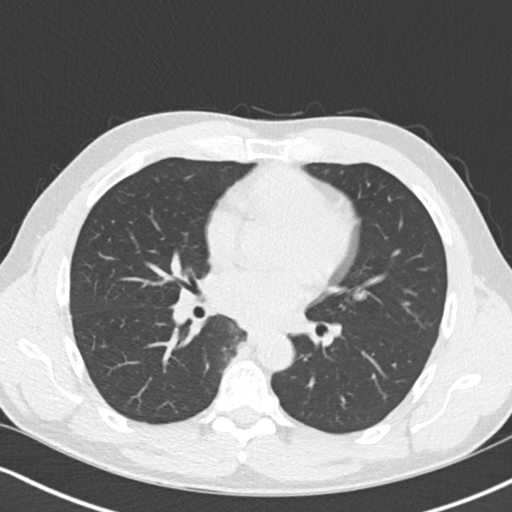
[im 81/148  lung]
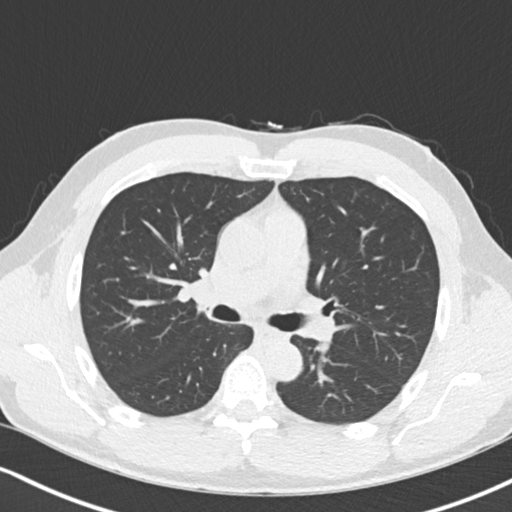
[im 94/148  lung]
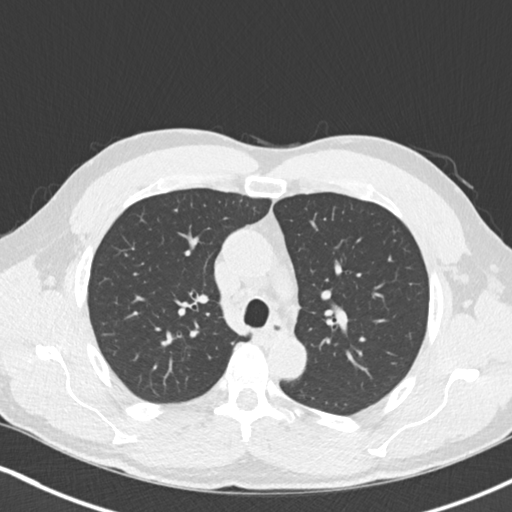
[im 101/148  mediastinal]
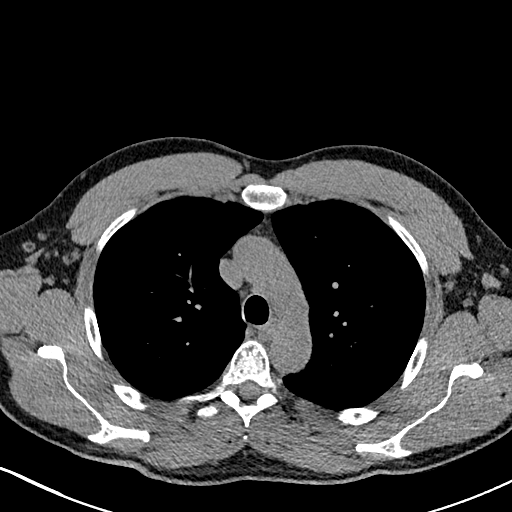
[im 101/148  lung]
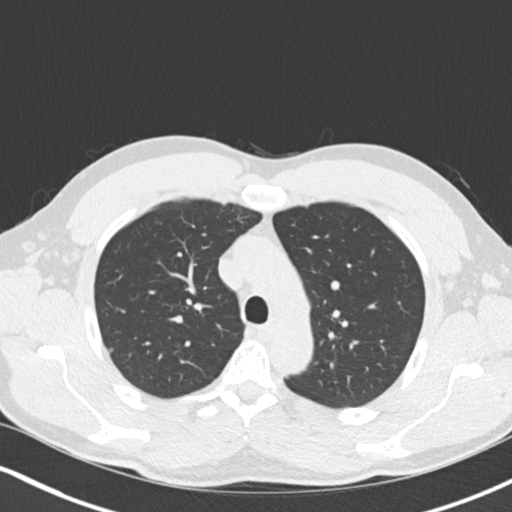
[im 114/148  lung]
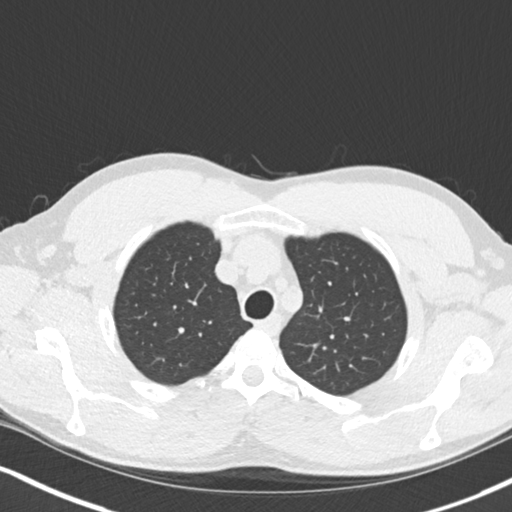
[im 127/148  lung]
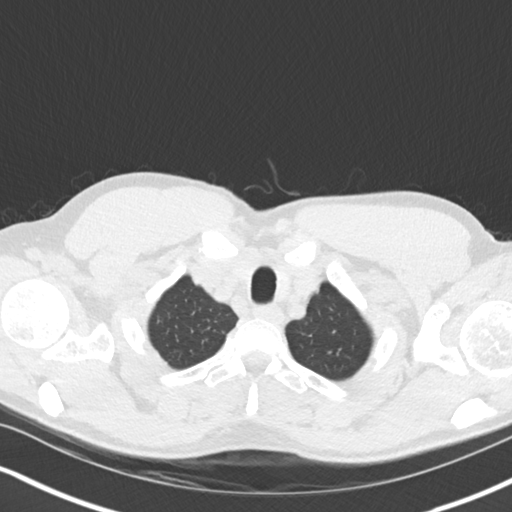
[im 141/148  lung]
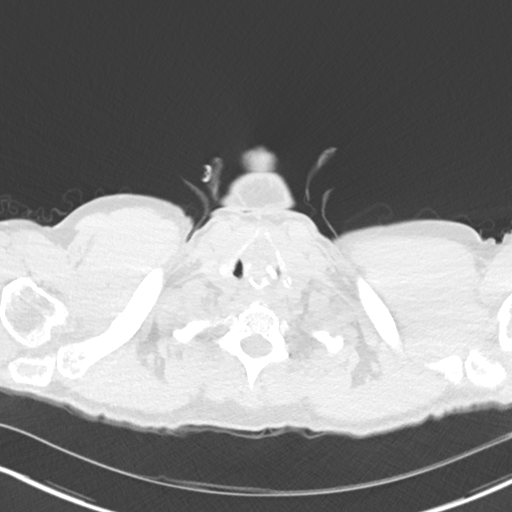

[Series 8: coronal · coronal · 0.61mm/px · 3 of 116 slices shown]
[im 24/116  lung]
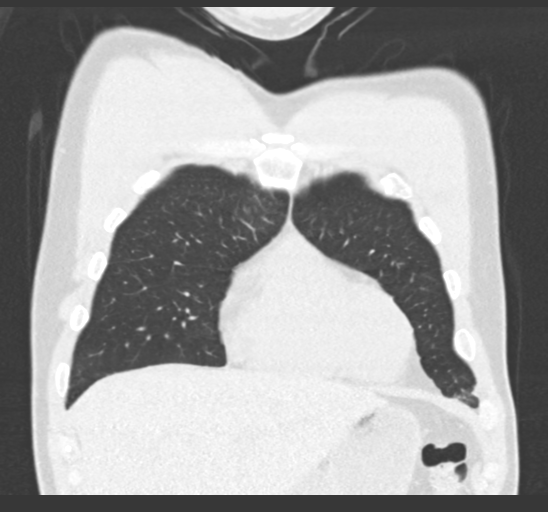
[im 47/116  lung]
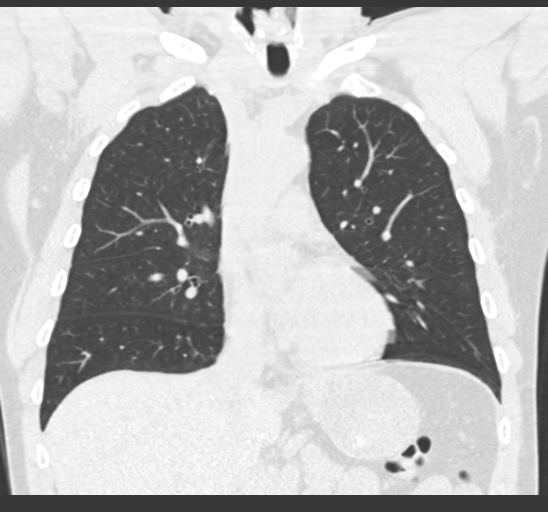
[im 70/116  lung]
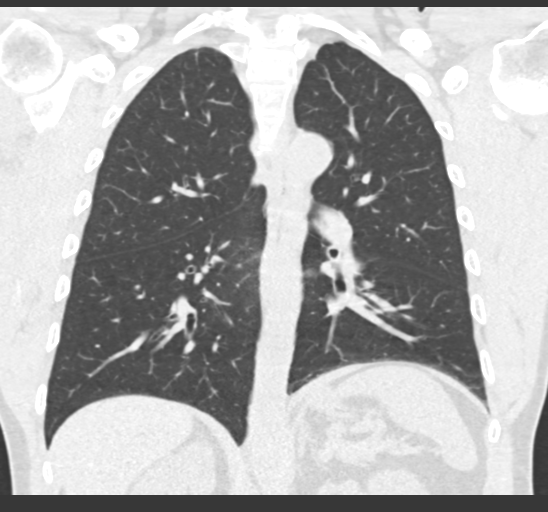

[15 of 36 positions shown; findings below may reference images not displayed]

FINDINGS: Cardiovascular: Vascular structures are unremarkable. Heart size
normal. No pericardial effusion.

Mediastinum/Nodes: High right paratracheal lymph node measures 10
mm. No additional borderline or enlarged mediastinal lymph nodes.
Hilar regions are difficult to evaluate without IV contrast. No
axillary adenopathy. Esophagus is grossly unremarkable.

Lungs/Pleura: Negative for subpleural reticulation, traction
bronchiectasis/bronchiolectasis, ground-glass, architectural
distortion or honeycombing. A few scattered peripheral pulmonary
nodules measure 5 mm or less in size. Mild subpleural ground-glass
and volume loss in the medial aspect of the superior segment right
lower lobe, nonspecific. Subpleural scarring in the lingula and left
lower lobe, adjacent to an elevated left hemidiaphragm. No pleural
fluid. No air trapping. Airway is unremarkable.

Upper Abdomen: Visualized portions of the liver, adrenal glands,
kidneys, spleen, pancreas stomach and bowel are grossly
unremarkable. No upper abdominal adenopathy.

Musculoskeletal: No worrisome lytic or sclerotic lesions.
IMPRESSION: 1. No evidence of interstitial lung disease.
2. Scattered pulmonary nodules measure 5 mm or less in size. No
follow-up needed if patient is low-risk (and has no known or
suspected primary neoplasm). Non-contrast chest CT can be considered
in 12 months if patient is high-risk. This recommendation follows
the consensus statement: Guidelines for Management of Incidental
Pulmonary Nodules Detected on CT Images: From the [HOSPITAL]
3. Subpleural ground-glass and mild volume loss in the medial aspect
of the superior segment right lower lobe, nonspecific but likely
infectious or inflammatory in etiology.

## 2022-01-12 ENCOUNTER — Other Ambulatory Visit: Payer: Self-pay | Admitting: Urology

## 2022-01-12 DIAGNOSIS — R972 Elevated prostate specific antigen [PSA]: Secondary | ICD-10-CM

## 2022-01-27 ENCOUNTER — Ambulatory Visit: Admission: RE | Admit: 2022-01-27 | Payer: 59 | Source: Ambulatory Visit

## 2022-02-03 ENCOUNTER — Ambulatory Visit
Admission: RE | Admit: 2022-02-03 | Discharge: 2022-02-03 | Disposition: A | Discharge status: Home / Self Care | Payer: 59 | Source: Ambulatory Visit | Attending: Urology | Admitting: Urology

## 2022-02-03 DIAGNOSIS — R972 Elevated prostate specific antigen [PSA]: Secondary | ICD-10-CM

## 2022-02-03 MED ORDER — GADOBENATE DIMEGLUMINE 529 MG/ML IV SOLN
12.0000 mL | Freq: Once | INTRAVENOUS | Status: AC | PRN
  Administered 2022-02-03: 12 mL via INTRAVENOUS

## 2022-11-17 ENCOUNTER — Other Ambulatory Visit: Payer: Self-pay | Admitting: Family Medicine

## 2022-11-17 DIAGNOSIS — R911 Solitary pulmonary nodule: Secondary | ICD-10-CM

## 2022-11-25 ENCOUNTER — Other Ambulatory Visit: Payer: Self-pay

## 2022-11-25 ENCOUNTER — Encounter (HOSPITAL_BASED_OUTPATIENT_CLINIC_OR_DEPARTMENT_OTHER): Payer: Self-pay | Admitting: Emergency Medicine

## 2022-11-25 ENCOUNTER — Emergency Department (HOSPITAL_BASED_OUTPATIENT_CLINIC_OR_DEPARTMENT_OTHER)
Admission: EM | Admit: 2022-11-25 | Discharge: 2022-11-25 | Disposition: A | Discharge status: Home / Self Care | Payer: 59 | Attending: Emergency Medicine | Admitting: Emergency Medicine

## 2022-11-25 DIAGNOSIS — M545 Low back pain, unspecified: Secondary | ICD-10-CM | POA: Insufficient documentation

## 2022-11-25 DIAGNOSIS — R519 Headache, unspecified: Secondary | ICD-10-CM | POA: Diagnosis not present

## 2022-11-25 DIAGNOSIS — S40812A Abrasion of left upper arm, initial encounter: Secondary | ICD-10-CM | POA: Insufficient documentation

## 2022-11-25 DIAGNOSIS — M542 Cervicalgia: Secondary | ICD-10-CM | POA: Insufficient documentation

## 2022-11-25 DIAGNOSIS — Y92410 Unspecified street and highway as the place of occurrence of the external cause: Secondary | ICD-10-CM | POA: Insufficient documentation

## 2022-11-25 LAB — RAPID URINE DRUG SCREEN, HOSP PERFORMED
Amphetamines: NOT DETECTED
Barbiturates: NOT DETECTED
Benzodiazepines: NOT DETECTED
Cocaine: NOT DETECTED
Opiates: NOT DETECTED
Tetrahydrocannabinol: NOT DETECTED

## 2022-11-25 NOTE — ED Notes (Signed)
Pt discharged to home, NAD noted 
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
## 2022-12-21 ENCOUNTER — Ambulatory Visit
Admission: RE | Admit: 2022-12-21 | Discharge: 2022-12-21 | Disposition: A | Discharge status: Home / Self Care | Payer: 59 | Source: Ambulatory Visit | Attending: Family Medicine | Admitting: Family Medicine

## 2022-12-21 DIAGNOSIS — R911 Solitary pulmonary nodule: Secondary | ICD-10-CM

## 2023-07-11 ENCOUNTER — Other Ambulatory Visit: Payer: Self-pay | Admitting: Family Medicine

## 2023-07-11 DIAGNOSIS — R9389 Abnormal findings on diagnostic imaging of other specified body structures: Secondary | ICD-10-CM

## 2023-07-24 ENCOUNTER — Ambulatory Visit
Admission: RE | Admit: 2023-07-24 | Discharge: 2023-07-24 | Disposition: A | Discharge status: Home / Self Care | Payer: 59 | Source: Ambulatory Visit | Attending: Family Medicine | Admitting: Family Medicine

## 2023-07-24 DIAGNOSIS — R9389 Abnormal findings on diagnostic imaging of other specified body structures: Secondary | ICD-10-CM

## 2024-01-02 ENCOUNTER — Emergency Department (HOSPITAL_BASED_OUTPATIENT_CLINIC_OR_DEPARTMENT_OTHER): Admitting: Radiology

## 2024-01-02 ENCOUNTER — Other Ambulatory Visit: Payer: Self-pay

## 2024-01-02 ENCOUNTER — Inpatient Hospital Stay (HOSPITAL_BASED_OUTPATIENT_CLINIC_OR_DEPARTMENT_OTHER)
Admission: EM | Admit: 2024-01-02 | Discharge: 2024-01-08 | DRG: 683 | Disposition: A | Discharge status: Home / Self Care | Attending: Internal Medicine | Admitting: Internal Medicine

## 2024-01-02 ENCOUNTER — Encounter (HOSPITAL_BASED_OUTPATIENT_CLINIC_OR_DEPARTMENT_OTHER): Payer: Self-pay | Admitting: Emergency Medicine

## 2024-01-02 ENCOUNTER — Emergency Department (HOSPITAL_BASED_OUTPATIENT_CLINIC_OR_DEPARTMENT_OTHER)

## 2024-01-02 DIAGNOSIS — E875 Hyperkalemia: Secondary | ICD-10-CM | POA: Diagnosis present

## 2024-01-02 DIAGNOSIS — J4540 Moderate persistent asthma, uncomplicated: Secondary | ICD-10-CM | POA: Diagnosis present

## 2024-01-02 DIAGNOSIS — I700 Atherosclerosis of aorta: Secondary | ICD-10-CM | POA: Diagnosis present

## 2024-01-02 DIAGNOSIS — J302 Other seasonal allergic rhinitis: Secondary | ICD-10-CM | POA: Diagnosis present

## 2024-01-02 DIAGNOSIS — N400 Enlarged prostate without lower urinary tract symptoms: Secondary | ICD-10-CM | POA: Diagnosis present

## 2024-01-02 DIAGNOSIS — E872 Acidosis, unspecified: Secondary | ICD-10-CM | POA: Diagnosis present

## 2024-01-02 DIAGNOSIS — Z79899 Other long term (current) drug therapy: Secondary | ICD-10-CM

## 2024-01-02 DIAGNOSIS — R768 Other specified abnormal immunological findings in serum: Secondary | ICD-10-CM | POA: Diagnosis present

## 2024-01-02 DIAGNOSIS — N179 Acute kidney failure, unspecified: Principal | ICD-10-CM | POA: Diagnosis present

## 2024-01-02 DIAGNOSIS — E785 Hyperlipidemia, unspecified: Secondary | ICD-10-CM | POA: Diagnosis present

## 2024-01-02 DIAGNOSIS — N170 Acute kidney failure with tubular necrosis: Secondary | ICD-10-CM | POA: Diagnosis present

## 2024-01-02 DIAGNOSIS — R109 Unspecified abdominal pain: Secondary | ICD-10-CM

## 2024-01-02 DIAGNOSIS — D638 Anemia in other chronic diseases classified elsewhere: Secondary | ICD-10-CM | POA: Diagnosis present

## 2024-01-02 DIAGNOSIS — R911 Solitary pulmonary nodule: Secondary | ICD-10-CM | POA: Diagnosis present

## 2024-01-02 DIAGNOSIS — Z1152 Encounter for screening for COVID-19: Secondary | ICD-10-CM

## 2024-01-02 DIAGNOSIS — K219 Gastro-esophageal reflux disease without esophagitis: Secondary | ICD-10-CM | POA: Diagnosis present

## 2024-01-02 DIAGNOSIS — R7401 Elevation of levels of liver transaminase levels: Secondary | ICD-10-CM | POA: Diagnosis present

## 2024-01-02 DIAGNOSIS — R310 Gross hematuria: Secondary | ICD-10-CM | POA: Diagnosis present

## 2024-01-02 DIAGNOSIS — R7989 Other specified abnormal findings of blood chemistry: Secondary | ICD-10-CM

## 2024-01-02 DIAGNOSIS — Z7951 Long term (current) use of inhaled steroids: Secondary | ICD-10-CM | POA: Diagnosis not present

## 2024-01-02 DIAGNOSIS — Z880 Allergy status to penicillin: Secondary | ICD-10-CM

## 2024-01-02 DIAGNOSIS — R0609 Other forms of dyspnea: Secondary | ICD-10-CM | POA: Diagnosis not present

## 2024-01-02 DIAGNOSIS — Z87442 Personal history of urinary calculi: Secondary | ICD-10-CM | POA: Diagnosis not present

## 2024-01-02 DIAGNOSIS — D649 Anemia, unspecified: Secondary | ICD-10-CM

## 2024-01-02 DIAGNOSIS — N200 Calculus of kidney: Secondary | ICD-10-CM

## 2024-01-02 HISTORY — DX: Disorder of kidney and ureter, unspecified: N28.9

## 2024-01-02 LAB — OCCULT BLOOD X 1 CARD TO LAB, STOOL: Fecal Occult Bld: NEGATIVE

## 2024-01-02 LAB — RESP PANEL BY RT-PCR (RSV, FLU A&B, COVID)  RVPGX2
Influenza A by PCR: NEGATIVE
Influenza B by PCR: NEGATIVE
Resp Syncytial Virus by PCR: NEGATIVE
SARS Coronavirus 2 by RT PCR: NEGATIVE

## 2024-01-02 LAB — I-STAT VENOUS BLOOD GAS, ED
Acid-base deficit: 9 mmol/L — ABNORMAL HIGH (ref 0.0–2.0)
Bicarbonate: 16.6 mmol/L — ABNORMAL LOW (ref 20.0–28.0)
Calcium, Ion: 1.23 mmol/L (ref 1.15–1.40)
HCT: 25 % — ABNORMAL LOW (ref 39.0–52.0)
Hemoglobin: 8.5 g/dL — ABNORMAL LOW (ref 13.0–17.0)
O2 Saturation: 59 %
Potassium: 5.3 mmol/L — ABNORMAL HIGH (ref 3.5–5.1)
Sodium: 135 mmol/L (ref 135–145)
TCO2: 18 mmol/L — ABNORMAL LOW (ref 22–32)
pCO2, Ven: 33.5 mmHg — ABNORMAL LOW (ref 44–60)
pH, Ven: 7.302 (ref 7.25–7.43)
pO2, Ven: 33 mmHg (ref 32–45)

## 2024-01-02 LAB — CBC
HCT: 28.1 % — ABNORMAL LOW (ref 39.0–52.0)
Hemoglobin: 9.4 g/dL — ABNORMAL LOW (ref 13.0–17.0)
MCH: 32.6 pg (ref 26.0–34.0)
MCHC: 33.5 g/dL (ref 30.0–36.0)
MCV: 97.6 fL (ref 80.0–100.0)
Platelets: 312 10*3/uL (ref 150–400)
RBC: 2.88 MIL/uL — ABNORMAL LOW (ref 4.22–5.81)
RDW: 12.1 % (ref 11.5–15.5)
WBC: 10.8 10*3/uL — ABNORMAL HIGH (ref 4.0–10.5)
nRBC: 0 % (ref 0.0–0.2)

## 2024-01-02 LAB — URINALYSIS, ROUTINE W REFLEX MICROSCOPIC
Bilirubin Urine: NEGATIVE
Glucose, UA: NEGATIVE mg/dL
Ketones, ur: NEGATIVE mg/dL
Leukocytes,Ua: NEGATIVE
Nitrite: NEGATIVE
Specific Gravity, Urine: 1.007 (ref 1.005–1.030)
pH: 5 (ref 5.0–8.0)

## 2024-01-02 LAB — CK: Total CK: 49 U/L (ref 49–397)

## 2024-01-02 LAB — TROPONIN T, HIGH SENSITIVITY
Troponin T High Sensitivity: 32 ng/L — ABNORMAL HIGH (ref <19)
Troponin T High Sensitivity: 30 ng/L — ABNORMAL HIGH (ref <19)

## 2024-01-02 LAB — BASIC METABOLIC PANEL WITH GFR
Anion gap: 22 — ABNORMAL HIGH (ref 5–15)
BUN: 147 mg/dL — ABNORMAL HIGH (ref 8–23)
CO2: 15 mmol/L — ABNORMAL LOW (ref 22–32)
Calcium: 9.6 mg/dL (ref 8.9–10.3)
Chloride: 98 mmol/L (ref 98–111)
Creatinine, Ser: 12.3 mg/dL — ABNORMAL HIGH (ref 0.61–1.24)
GFR, Estimated: 4 mL/min — ABNORMAL LOW (ref >60)
Glucose, Bld: 114 mg/dL — ABNORMAL HIGH (ref 70–99)
Potassium: 5.6 mmol/L — ABNORMAL HIGH (ref 3.5–5.1)
Sodium: 135 mmol/L (ref 135–145)

## 2024-01-02 LAB — HEPATIC FUNCTION PANEL
ALT: 62 U/L — ABNORMAL HIGH (ref 0–44)
AST: 40 U/L (ref 15–41)
Albumin: 4 g/dL (ref 3.5–5.0)
Alkaline Phosphatase: 102 U/L (ref 38–126)
Bilirubin, Direct: 0.3 mg/dL — ABNORMAL HIGH (ref 0.0–0.2)
Indirect Bilirubin: 0.3 mg/dL (ref 0.3–0.9)
Total Bilirubin: 0.6 mg/dL (ref 0.0–1.2)
Total Protein: 7.3 g/dL (ref 6.5–8.1)

## 2024-01-02 LAB — PRO BRAIN NATRIURETIC PEPTIDE: Pro Brain Natriuretic Peptide: 933 pg/mL — ABNORMAL HIGH (ref <300.0)

## 2024-01-02 LAB — D-DIMER, QUANTITATIVE: D-Dimer, Quant: 0.78 ug{FEU}/mL — ABNORMAL HIGH (ref 0.00–0.50)

## 2024-01-02 LAB — C-REACTIVE PROTEIN: CRP: 6.1 mg/dL — ABNORMAL HIGH

## 2024-01-02 MED ORDER — ORAL CARE MOUTH RINSE: 15.0000 mL | OROMUCOSAL | Status: DC | PRN

## 2024-01-02 MED ORDER — SODIUM ZIRCONIUM CYCLOSILICATE 10 G PO PACK
10.0000 g | PACK | Freq: Once | ORAL | Status: AC
  Administered 2024-01-02: 10 g via ORAL
  Filled 2024-01-02: qty 1

## 2024-01-02 MED ORDER — SODIUM CHLORIDE 0.9 % IV BOLUS: 75.0000 mL | Freq: Once | INTRAVENOUS | Status: DC

## 2024-01-02 MED ORDER — SODIUM CHLORIDE 0.9 % IV BOLUS
1000.0000 mL | Freq: Once | INTRAVENOUS | Status: AC
  Administered 2024-01-02: 1000 mL via INTRAVENOUS

## 2024-01-02 MED ORDER — ALBUTEROL SULFATE HFA 108 (90 BASE) MCG/ACT IN AERS: 1.0000 | INHALATION_SPRAY | Freq: Four times a day (QID) | RESPIRATORY_TRACT | Status: DC | PRN

## 2024-01-02 MED ORDER — FLUTICASONE FUROATE-VILANTEROL 200-25 MCG/ACT IN AEPB
1.0000 | INHALATION_SPRAY | Freq: Every day | RESPIRATORY_TRACT | Status: DC
  Administered 2024-01-03 – 2024-01-07 (×5): 1 via RESPIRATORY_TRACT
  Filled 2024-01-02: qty 28

## 2024-01-02 MED ORDER — LACTATED RINGERS IV BOLUS
2000.0000 mL | Freq: Once | INTRAVENOUS | Status: AC
  Administered 2024-01-02: 2000 mL via INTRAVENOUS

## 2024-01-02 MED ORDER — SODIUM CHLORIDE 0.9 % IV BOLUS: 1000.0000 mL | Freq: Once | INTRAVENOUS | Status: DC

## 2024-01-02 NOTE — Progress Notes (Signed)
 New Admission Note:  Arrival Method: [facility] Mental Orientation: Alert and oriented x 4 Telemetry: Box 16 Assessment: Completed Skin: Warm and dry  IV: NSL Pain: Denies  Tubes: N/A Safety Measures: Safety Fall Prevention Plan initiated.  Admission: Completed 5 M  Orientation: Patient has been orientated to the room, unit and the staff. Welcome booklet given.  Family: N/A  Orders have been reviewed and implemented. Will continue to monitor the patient. Call light has been placed within reach and bed alarm has been activated.   Woodward Head BSN, RN  Phone Number: [phone]

## 2024-01-02 NOTE — ED Triage Notes (Signed)
 Pt via pov from home with "labored breathing" since yesterday. He states that he has been battling kidney stones for the last few weeks, and this started yesterday. He was told by his PCP that he may have a blood clot in his lungs. States he has tightness in his sternum, and feels like he can't get a deep breath. Pt alert & oriented, nad noted.

## 2024-01-02 NOTE — Plan of Care (Signed)
   Problem: Education: Goal: Knowledge of General Education information will improve Description Including pain rating scale, medication(s)/side effects and non-pharmacologic comfort measures Outcome: Progressing

## 2024-01-02 NOTE — Progress Notes (Signed)
 Plan of Care Note for accepted transfer   Patient: Tyler Mccoy MRN: [id number]   DOA: 01/02/2024  [facility] transfer: [facility] Requesting Provider: Lowery Rue, DO, Darlis Eisenmenger, PA-C Reason for transfer: Acute kidney injury Facility course: 62 year old with history of asthma and allergic rhinitis presented to [facility] for evaluation of left flank pain, shortness of breath and gross hematuria.  Seen by PCP last week and CMP on 5/22 showed BUN/creatinine 13/1.06 with GFR 79.  Initial vitals stable with patient afebrile, SBP 120s to 140s and SpO2 100% on room air.  Initial labs significant for BUN/creatinine of 47/12.30, GFR of 4, K+ 5.6, new anemia with Hgb 9.5, proBNP 933, troponin T slightly elevated but flat at 32->30, D-dimer 0.78.  Unable to get CT with contrast due to renal failure.  CT chest/abdomen/pelvis without contrast shows a 5 mm right middle lobe nodule but no intrathoracic, abdominal or pelvic pathology and no hydronephrosis or nephrolithiasis.  Patient received IV NS 1 L bolus and Lokelma 10 g x 1.  Nephrology was consulted for evaluation. [facility] was consulted for admission  Plan of care: The patient is accepted for admission to Telemetry unit, at [facility]..  Admitted for further management of acute renal failure Inform on-call nephrologist when patient arrives to [facility]  Author: Vita Grip, MD 01/02/2024  Check [email or web address] for on-call coverage.  Nursing staff, Please call [facility] Admits & Consults System-Wide number on Amion as soon as patient's arrival, so appropriate admitting provider can evaluate the pt.

## 2024-01-02 NOTE — ED Provider Notes (Signed)
 [place] EMERGENCY DEPARTMENT AT [facility] Provider Note   CSN: [id number] Arrival date & time: 01/02/24  1427    History  Chief Complaint  Patient presents with   Shortness of Breath    Lindy Pennisi is a 63 y.o. male.  HPI   63 year old male presents with SHOB, left flank pain, gross hematuria.  Noted 12/12/23 developed left flank pain and hematuria. Patient notes similar episode a few years ago, so he followed up with [facility]. Had XR which was negative but then had 3 episodes of gross hematuria (has not seen or heard a stone). He was scheduled CT today but scanner was down so he came to the ER due to feeling so poorly.    Was seen by PCP at [facility] last week, had labs that were normal (CMP noted below).   Now feeling feverish with max temp 99.6 (normal 97.8), with nausea and vomiting. Unsure if due to possible exposure to someone with a stomach virus on Thursday, vomited all Thursday night/dry heaves. Now with cough, SHOB, wheezing. Hx silent reflux (takes a lot of Tums). Notes his stools have been brown/red/orange. Has been taking Advil for his flank pain, assuming he has a kidney stone.   Labs from 12/27/23 BUN13 Cr 1.06 GFR 79 K 5.4 CO2 29 Gap 11.1 Ca 10.8 Protein 7.5 Albumin 4.7 Tbili 0.6 AST 22 ALT 20  Home Medications Prior to Admission medications   Medication Sig Start Date End Date Taking? Authorizing Provider  albuterol (PROVENTIL HFA;VENTOLIN HFA) 108 (90 Base) MCG/ACT inhaler Inhale 1-2 puffs into the lungs every 6 (six) hours as needed for wheezing or shortness of breath.    [provider]  Ascorbic Acid (VITAMIN C) 100 MG tablet Take 1,000 mg by mouth daily.     [provider]  budesonide-formoterol (SYMBICORT) 160-4.5 MCG/ACT inhaler Inhale 2 puffs into the lungs 2 (two) times daily.    [provider]  cetirizine (ZYRTEC) 10 MG tablet Take 10 mg by mouth daily.    [provider]  ipratropium  (ATROVENT ) 0.06 % nasal spray Place 2 sprays into both nostrils 4 (four) times daily as needed for rhinitis. 11/15/17   Marine Sia, MD  Multiple Vitamin (MULTIVITAMIN) capsule Take 1 capsule by mouth daily.    [provider]  omega-3 acid ethyl esters (LOVAZA) 1 G capsule Take 1 g by mouth 2 (two) times daily.    [provider]  ranitidine (ZANTAC) 150 MG tablet Take 150 mg by mouth at bedtime. 03/21/18   [provider]  Respiratory Therapy Supplies (FLUTTER) DEVI Use as directed. 11/15/17   Marine Sia, MD      Allergies    Amoxicillin    Review of Systems   Review of Systems Negative except as per HPI Physical Exam Updated Vital Signs BP (!) 143/76   Pulse 78   Temp 98.2 F (36.8 C)   Resp 20   Ht 5\' 5"  (1.651 m)   Wt 61.2 kg   SpO2 98%   BMI 22.45 kg/m  Physical Exam Vitals and nursing note reviewed.  Constitutional:      General: He is not in acute distress.    Appearance: He is well-developed. He is not diaphoretic.  HENT:     Head: Normocephalic and atraumatic.  Cardiovascular:     Rate and Rhythm: Normal rate and regular rhythm.     Pulses: Normal pulses.     Heart sounds:  Normal heart sounds.  Pulmonary:     Effort: Pulmonary effort is normal.     Breath sounds: Wheezing present.  Chest:     Chest wall: No tenderness.  Abdominal:     Palpations: Abdomen is soft.     Tenderness: There is no abdominal tenderness. There is no right CVA tenderness or left CVA tenderness.  Musculoskeletal:     Right lower leg: No tenderness. No edema.     Left lower leg: No tenderness. No edema.  Skin:    General: Skin is warm and dry.     Findings: No erythema.     Nails: There is no clubbing.  Neurological:     Mental Status: He is alert and oriented to person, place, and time.  Psychiatric:        Behavior: Behavior normal.     ED Results / Procedures / Treatments   Labs (all labs ordered are listed, but only abnormal results  are displayed) Labs Reviewed  BASIC METABOLIC PANEL WITH GFR - Abnormal; Notable for the following components:      Result Value   Potassium 5.6 (*)    CO2 15 (*)    Glucose, Bld 114 (*)    BUN 147 (*)    Creatinine, Ser 12.30 (*)    GFR, Estimated 4 (*)    Anion gap 22 (*)    All other components within normal limits  CBC - Abnormal; Notable for the following components:   WBC 10.8 (*)    RBC 2.88 (*)    Hemoglobin 9.4 (*)    HCT 28.1 (*)    All other components within normal limits  D-DIMER, QUANTITATIVE - Abnormal; Notable for the following components:   D-Dimer, Quant 0.78 (*)    All other components within normal limits  HEPATIC FUNCTION PANEL - Abnormal; Notable for the following components:   ALT 62 (*)    Bilirubin, Direct 0.3 (*)    All other components within normal limits  PRO BRAIN NATRIURETIC PEPTIDE - Abnormal; Notable for the following components:   Pro Brain Natriuretic Peptide 933.0 (*)    All other components within normal limits  URINALYSIS, ROUTINE W REFLEX MICROSCOPIC - Abnormal; Notable for the following components:   Color, Urine COLORLESS (*)    Hgb urine dipstick SMALL (*)    Protein, ur TRACE (*)    Bacteria, UA RARE (*)    All other components within normal limits  I-STAT VENOUS BLOOD GAS, ED - Abnormal; Notable for the following components:   pCO2, Ven 33.5 (*)    Bicarbonate 16.6 (*)    TCO2 18 (*)    Acid-base deficit 9.0 (*)    Potassium 5.3 (*)    HCT 25.0 (*)    Hemoglobin 8.5 (*)    All other components within normal limits  TROPONIN T, HIGH SENSITIVITY - Abnormal; Notable for the following components:   Troponin T High Sensitivity 32 (*)    All other components within normal limits  TROPONIN T, HIGH SENSITIVITY - Abnormal; Notable for the following components:   Troponin T High Sensitivity 30 (*)    All other components within normal limits  RESP PANEL BY RT-PCR (RSV, FLU A&B, COVID)  RVPGX2  CK  OCCULT BLOOD X 1 CARD TO LAB, STOOL     EKG EKG Interpretation Date/Time:  Wednesday Jan 02 2024 14:38:29 EDT Ventricular Rate:  77 PR Interval:  171 QRS Duration:  69 QT Interval:  358 QTC Calculation: 406  R Axis:   51  Text Interpretation: Sinus rhythm Confirmed by Lowery Rue [phone]) on 01/02/2024 3:18:30 PM  Radiology CT CHEST ABDOMEN PELVIS WO CONTRAST Result Date: 01/02/2024 CLINICAL DATA:  Left flank pain.  History of kidney stones. EXAM: CT CHEST, ABDOMEN AND PELVIS WITHOUT CONTRAST TECHNIQUE: Multidetector CT imaging of the chest, abdomen and pelvis was performed following the standard protocol without IV contrast. RADIATION DOSE REDUCTION: This exam was performed according to the departmental dose-optimization program which includes automated exposure control, adjustment of the mA and/or kV according to patient size and/or use of iterative reconstruction technique. COMPARISON:  Chest CT dated 07/24/2023 and CT abdomen pelvis dated 08/22/2022. FINDINGS: Evaluation of this exam is limited in the absence of intravenous contrast. CT CHEST FINDINGS Cardiovascular: There is no cardiomegaly or pericardial effusion. The thoracic aorta and the central pulmonary arteries are grossly unremarkable on this noncontrast CT. Mediastinum/Nodes: No hilar or mediastinal adenopathy. The esophagus and the thyroid gland are grossly unremarkable. No mediastinal fluid collection. Lungs/Pleura: No focal consolidation, pleural effusion, pneumothorax. There is a 5 mm right middle lobe nodule (130/3). The central airways are patent. Musculoskeletal: No acute osseous pathology. CT ABDOMEN PELVIS FINDINGS No intra-abdominal free air or free fluid. Hepatobiliary: The liver is unremarkable. No acute tension. The gallbladder is unremarkable. Pancreas: Unremarkable. No pancreatic ductal dilatation or surrounding inflammatory changes. Spleen: Normal in size without focal abnormality. Adrenals/Urinary Tract: The adrenal glands are unremarkable. The kidneys,  visualized ureters, and urinary bladder appear unremarkable. Stomach/Bowel: There is no bowel obstruction or active inflammation. Mild sigmoid diverticulosis. The appendix is normal. Vascular/Lymphatic: Mild atherosclerotic calcification of the abdominal aorta. The IVC is unremarkable. No portal venous gas. There is no adenopathy. Reproductive: Mildly enlarged prostate gland measuring 5 cm in transverse axial diameter. Other: None Musculoskeletal: No acute or significant osseous findings. IMPRESSION: 1. No acute intrathoracic, abdominal, or pelvic pathology. No hydronephrosis or nephrolithiasis. 2. A 5 mm right middle lobe nodule. No follow-up needed if patient is low-risk.This recommendation follows the consensus statement: Guidelines for Management of Incidental Pulmonary Nodules Detected on CT Images: From the Fleischner Society 2017; Radiology 2017; 284:228-243. 3. Mild sigmoid diverticulosis. No bowel obstruction. Normal appendix. 4.  Aortic Atherosclerosis (ICD10-I70.0). Electronically Signed   By: Angus Bark M.D.   On: 01/02/2024 17:48   DG Chest 2 View Result Date: 01/02/2024 CLINICAL DATA:  sob, labored breathing EXAM: CHEST - 2 VIEW COMPARISON:  October 25, 2017, July 24, 2023 FINDINGS: No focal airspace consolidation, pleural effusion, or pneumothorax. No cardiomegaly. No acute fracture or destructive lesion. Multilevel thoracic osteophytosis. IMPRESSION: No acute cardiopulmonary abnormality. Electronically Signed   By: Rance Burrows M.D.   On: 01/02/2024 16:44    Procedures .Critical Care  Performed by: Darlis Eisenmenger, PA-C Authorized by: Darlis Eisenmenger, PA-C   Critical care provider statement:    Critical care time (minutes):  75   Critical care was time spent personally by me on the following activities:  Development of treatment plan with patient or surrogate, discussions with consultants, evaluation of patient's response to treatment, examination of patient, ordering and  review of laboratory studies, ordering and review of radiographic studies, ordering and performing treatments and interventions, pulse oximetry, re-evaluation of patient's condition and review of old charts     Medications Ordered in ED Medications  sodium zirconium cyclosilicate (LOKELMA) packet 10 g (10 g Oral Given 01/02/24 1834)  sodium chloride 0.9 % bolus 1,000 mL (1,000 mLs Intravenous New Bag/Given 01/02/24 1839)  ED Course/ Medical Decision Making/ A&P Clinical Course as of 01/02/24 1856  Wed Jan 02, 2024  1630 Sees Dr. Rozanne Corners at [facility], no prior stones. Seen for BPH. Has had hematuria 2 years ago in [place], [place].  Hematuria after a run, took vitamin C and cranberry juice and this resolved. That following Sunday ran again and had gross hematuria. Followed up with Dr. Rozanne Corners a few weeks later for cystoscopy and CT scan at [facility] imaging which showed umbilical hernia (small), found lung nodules and recommended monitoring. Goes for pulmonary CT every 6 months for this.  [LM]  1636 Stools brown/red/orange.  [LM]  1748 Potassium(!): 5.6 [LM]    Clinical Course User Index [LM] Darlis Eisenmenger, PA-C                                 Medical Decision Making Amount and/or Complexity of Data Reviewed Labs: ordered. Radiology: ordered.  Risk Decision regarding hospitalization.   This patient presents to the ED for concern of left flank pain, gross hematuria, SHOB, n/v, this involves an extensive number of treatment options, and is a complaint that carries with it a high risk of complications and morbidity.  The differential diagnosis includes but not limited to PE, CHF, renal mass, GI bleed, rhabdo, metabolic/electrolyte    Co morbidities / Chronic conditions that complicate the patient evaluation  Pulmonary nodules, asthma, silent reflux    Additional history obtained:  Additional history obtained from EMR External records from outside source obtained and reviewed  including Labs on file on patient's phone/EMR system, wife at bedside contributes to history    Lab Tests:  I Ordered, and personally interpreted labs.  The pertinent results include: VBG with normal pH.  Pro BNP elevated at 933.  CK normal at 49.  Initial troponin is elevated at 32, repeat is 30.  Urinalysis with small hemoglobin, trace protein, rare bacteria.  BMP with hyperkalemia at 5.6.  Creatinine notably elevated at 12.3, previously 1.0.  GFR is 4.  Bicarb of 15 and gap of 22.  ALT slightly elevated at 62.  Hemoccult negative.  Negative for COVID, flu, RSV.  D-dimer is elevated at 0.78.  CBC with anemia at 9.4 with hematocrit of 28.1.  WBC minimally elevated at 10.8.   Imaging Studies ordered:  I ordered imaging studies including chest x-ray, CT chest/abdomen/pelvis I independently visualized and interpreted imaging which showed chest x-ray with no acute process. CT without acute findings to explain patient's symptoms today I agree with the radiologist interpretation   Cardiac Monitoring: / EKG:  The patient was maintained on a cardiac monitor.  I personally viewed and interpreted the cardiac monitored which showed an underlying rhythm of: sinus rhythm, rate 77   Problem List / ED Course / Critical interventions / Medication management  63 year old male with past medical history of asthma/allergies, silent reflux presents the emergency room with complaint of left flank pain and gross hematuria onset May 7.  Patient states that he had a similar episode a few years ago for which she was worked up through [facility] urology, no kidney stones, did have a cystoscopy that was normal.  Patient figured that he was having a kidney stone and took Motrin for his symptoms.  Symptoms progressed to worsening pain, significant nonbloody emesis for the past week and a half and now feeling short of breath.  Patient was seen at his [facility] PCP on Dec 27, 2023,  had labs done at that time and he is able to show  me his CMP results which were normal.  Specifically, his creatinine was 1.0.  Unable to locate a recent CBC.  On exam today, it has mild diffuse wheezing, no lower extremity edema.  He denies orthopnea.  Labs with significant abnormalities including a creatinine of 12, elevated BUN, new anemia with hemoglobin of 9.5.  He is Hemoccult negative.  D-dimer is positive, unable to complete CT chest with contrast due to acute renal failure.  Troponins are elevated although flat.  EKG with slight elevation in T waves.  He is provided with Lokelma .  Gentle IV fluids due to elevated proBNP.  Plan is to admit to [facility] for further management.  Discussed with hospitalist who will admit, requests update after consult with nephrology. Care signed out at change of shift pending nephrology consult.  I ordered medication including lokelma  for hyperK   Reevaluation of the patient after these medicines showed that the patient stable I have reviewed the patients home medicines and have made adjustments as needed   Consultations Obtained:  I requested consultation with the ER attending, Dr. Colonel Dears,  and discussed lab and imaging findings as well as pertinent plan - they recommend: lokelma , admit to [facility].  Case discussed with Dr. Yvonne Hering with [facility] who will request bed at [facility], requests secure chat update after consult with nephrology.   Social Determinants of Health:  Lives with spouse, has PCP   Test / Admission - Considered:  admit         Final Clinical Impression(s) / ED Diagnoses Final diagnoses:  Acute renal failure, unspecified acute renal failure type (HCC)  Anemia, unspecified type  Elevated troponin  Hyperkalemia  Metabolic acidosis    Rx / DC Orders ED Discharge Orders     None         Darlis Eisenmenger, PA-C 01/02/24 1856    Lowery Rue, DO 01/02/24 1906

## 2024-01-02 NOTE — Consult Note (Signed)
 Nephrology Consult   Requesting provider: [facility] requesting consult: Hospitalist Reason for consult: AKI   Assessment/Recommendations: Tyler Mccoy is a/an 63 y.o. male with a past medical history silent reflux with reactive airway disease who present w/ pulmonary wheezing and acute renal failure  Acute Renal Failure: Extensive AKI with normal creatinine about a week ago.  First concern would be dehydration in the setting of recent GI illness as well as NSAID use ultimately leading to tubular injury.  However his history has several clues that are concerning.  Pulmonary disease is somewhat strange and could represent vasculitis.  Also elevated proBNP as well as anemia are concerning and not fully explained.  Despite having a fairly bland urine I think he deserves evaluation for glomerular disease while we hydrate and provide supportive care -Continue with IVFs for now -GN serologies ordered; f/u as they return -Wheezing management as below -Hoping he will improve with fluids.  If not he may require dialysis.  We did not discuss this extensively this evening.  He has no acute indication for dialysis and does not uremic -Would consider kidney biopsy if he fails to improve -Continue to monitor daily Cr, Dose meds for GFR -Monitor Daily I/Os, Daily weight  -Maintain MAP>65 for optimal renal perfusion.  -Avoid nephrotoxic medications including NSAIDs -Use synthetic opioids (Fentanyl/Dilaudid) if needed  Wheezing: Chronic issue related to what is felt to be silent reflux.  However, given his other medical issues and current exacerbation he may benefit from further evaluation. DAH is in the differential but the imaging not obviously concerning for this send no current hemoptysis - Recommend pulmonology consult when he transfers to [facility] - Evaluate for vasculitis as above - Supportive care with oxygen, inhalers as needed  Anemia: Unclear cause.  No blood loss per patient.   Obtain iron studies.  Transfuse if needed  Elevated proBNP: fairly elevated for an otherwise healthy gentleman. Obtain Echo especially given his lung issues.  Metabolic acidosis: Continue to monitor with hydration  Hyperkalemia: Mild.  Continue to monitor closely  ALT elevation/bilirubin elevation: Mild.  Will continue to monitor   Recommendations conveyed to primary service.    Levorn Reason [facility] 01/02/2024 8:19 PM   _____________________________________________________________________________________ CC: shortness of breath  History of Present Illness: Tyler Mccoy is a/an 63 y.o. male with a past medical history of reactive airway disease felt to be from reflux who presents with shortness of breath and flank pain.  Patient is fairly healthy and follows a rigorous exercise regimen  Patient states he was feeling his usual self until 12/12/2023 when he developed left flank pain.  There is some documentation that he had hematuria but the patient did not relate this history to me.  What he related to me was microscopic hematuria 2 years ago associated with exercise that was evaluated by urology at that time and felt to be benign.  However it was documented by the emergency team that he had some gross hematuria on 12/12/2023.  Either way he experiences flank pain and thought it was muscular issues related to exercise.  He took ibuprofen and the pain improved but did not go away.  It also began to involve both sides.  He was a little bit worried about kidney stones he saw urology who did a x-ray which was negative for stones.  He has never had a kidney stone before.  He also saw his PCP on Thursday 5/22 and had labs with a creatinine of 1.1.  On that same day he experienced a GI illness that lasted about 1 day.  He had significant nausea and vomiting with mild elevation in temperature at 99.6.  He thinks it was because he was around somebody else who had been ill with a  GI bug around that time.  Because of his concern regarding a kidney stones he stopped taking Tums which he usually takes for silent reflux.  However his symptoms of shortness of breath worsened when he stopped this.  He has seen pulmonology in the past and was felt to have silent reflux symptoms causing reactive airway disease.  He requires a fairly extensive regimen to keep his symptoms at bay.  He does have issues with coughing because of this reactive airway disease and sometimes brings up bright red blood with his sputum.  He has never had frank mopped assist.  He denies chills, dysuria, edema, rashes, nasal bleeding.'  He saw his PCP today and because of his wheezing it was recommended he come to the emergency department.  In the emergency department he was noted to have a creatinine of 12.  Slight anemia, proBNP elevated, CK normal, urinalysis without extensive cellularity, CT scan without signs of obstruction.  CT scan did demonstrate chronic lung nodules.   PFT per Dr. Elverna Hamman note: April 2019 some small airways obstruction that improved with bronchodilator FEV1 2.47 L 80% predicted, FVC 3.20 L 79% predicted, total lung capacity 6.01 L 100% predicted, DLCO 30.3 mL 118% predicted  Medications:  No current facility-administered medications for this encounter.   Current Outpatient Medications  Medication Sig Dispense Refill   albuterol (PROVENTIL HFA;VENTOLIN HFA) 108 (90 Base) MCG/ACT inhaler Inhale 1-2 puffs into the lungs every 6 (six) hours as needed for wheezing or shortness of breath.     Ascorbic Acid (VITAMIN C) 100 MG tablet Take 1,000 mg by mouth daily.      budesonide-formoterol (SYMBICORT) 160-4.5 MCG/ACT inhaler Inhale 2 puffs into the lungs 2 (two) times daily.     cetirizine (ZYRTEC) 10 MG tablet Take 10 mg by mouth daily.     ipratropium (ATROVENT ) 0.06 % nasal spray Place 2 sprays into both nostrils 4 (four) times daily as needed for rhinitis. 15 mL 12   Multiple Vitamin  (MULTIVITAMIN) capsule Take 1 capsule by mouth daily.     omega-3 acid ethyl esters (LOVAZA) 1 G capsule Take 1 g by mouth 2 (two) times daily.     ranitidine (ZANTAC) 150 MG tablet Take 150 mg by mouth at bedtime.  12   Respiratory Therapy Supplies (FLUTTER) DEVI Use as directed. 1 each 0     ALLERGIES Amoxicillin  MEDICAL HISTORY Past Medical History:  Diagnosis Date   Renal disorder    Seasonal allergies      SOCIAL HISTORY Social History   Socioeconomic History   Marital status: Married    Spouse name: Not on file   Number of children: Not on file   Years of education: Not on file   Highest education level: Not on file  Occupational History   Not on file  Tobacco Use   Smoking status: Never   Smokeless tobacco: Never  Vaping Use   Vaping status: Never Used  Substance and Sexual Activity   Alcohol use: Yes    Comment: occasionally   Drug use: Never   Sexual activity: Not on file  Other Topics Concern   Not on file  Social History Narrative   Not on file   Social  Drivers of Corporate investment banker Strain: Not on file  Food Insecurity: Not on file  Transportation Needs: Not on file  Physical Activity: Not on file  Stress: Not on file  Social Connections: Not on file  Intimate Partner Violence: Not on file     FAMILY HISTORY Family History  Problem Relation Age of Onset   Asthma Son       Review of Systems: 12 systems reviewed Otherwise as per HPI, all other systems reviewed and negative  Physical Exam: Vitals:   01/02/24 1945 01/02/24 2005  BP: (!) 145/119   Pulse: 90   Resp:    Temp:  98.2 F (36.8 C)  SpO2: 99%    No intake/output data recorded. No intake or output data in the 24 hours ending 01/02/24 2019 General: well-appearing, no acute distress HEENT: anicteric sclera, oropharynx clear without lesions CV: regular rate, normal rhythm, no murmurs, no gallops, no rubs, no peripheral edema Lungs: diffuse inspiratory and expiratory  wheezing heard throughout, no crackles or rhonchi, mild iwob Abd: soft, non-tender, non-distended Skin: no visible lesions or rashes Psych: alert, engaged, appropriate mood and affect Musculoskeletal: no obvious deformities Neuro: normal speech, no gross focal deficits   Test Results Reviewed Lab Results  Component Value Date   NA 135 01/02/2024   K 5.3 (H) 01/02/2024   CL 98 01/02/2024   CO2 15 (L) 01/02/2024   BUN 147 (H) 01/02/2024   CREATININE 12.30 (H) 01/02/2024   CALCIUM 9.6 01/02/2024   ALBUMIN 4.0 01/02/2024    CBC Recent Labs  Lab 01/02/24 1447 01/02/24 1731  WBC 10.8*  --   HGB 9.4* 8.5*  HCT 28.1* 25.0*  MCV 97.6  --   PLT 312  --     I have reviewed all relevant outside healthcare records related to the patient's current hospitalization

## 2024-01-02 NOTE — ED Notes (Signed)
Specialist at bedside.

## 2024-01-02 NOTE — ED Provider Notes (Signed)
 Signout received on this 63 year old male pending nephrology consult.  Already admitted to hospitalist service. Physical Exam  BP (!) 145/119   Pulse 90   Temp 98.2 F (36.8 C) (Oral)   Resp 18   Ht 5\' 5"  (1.651 m)   Wt 61.2 kg   SpO2 99%   BMI 22.45 kg/m     Procedures  Procedures  ED Course / MDM   Clinical Course as of 01/02/24 2006  Wed Jan 02, 2024  1630 Sees Dr. Rozanne Corners at [facility], no prior stones. Seen for BPH. Has had hematuria 2 years ago in [place], [place].  Hematuria after a run, took vitamin C and cranberry juice and this resolved. That following Sunday ran again and had gross hematuria. Followed up with Dr. Rozanne Corners a few weeks later for cystoscopy and CT scan at [facility] imaging which showed umbilical hernia (small), found lung nodules and recommended monitoring. Goes for pulmonary CT every 6 months for this.  [LM]  1636 Stools brown/red/orange.  [LM]  1748 Potassium(!): 5.6 [LM]  1907 DG Chest 2 View [AA]    Clinical Course User Index [AA] Lucina Sabal, PA-C [LM] Darlis Eisenmenger, PA-C   Medical Decision Making Amount and/or Complexity of Data Reviewed Labs: ordered. Decision-making details documented in ED Course. Radiology: ordered. Decision-making details documented in ED Course.  Risk Prescription drug management. Decision regarding hospitalization.   Discussed with nephrology.  They recommend volume resuscitation.  Bedside ultrasound shows compressible IVC.  2 additional liter fluid bolus ordered. Nephrology will consult.  Notified hospitalist.       Lucina Sabal, PA-C 01/02/24 2007    Lowery Rue, DO 01/02/24 2035

## 2024-01-02 NOTE — ED Notes (Signed)
 Ambulatory to restroom

## 2024-01-03 ENCOUNTER — Inpatient Hospital Stay (HOSPITAL_COMMUNITY)

## 2024-01-03 DIAGNOSIS — R0609 Other forms of dyspnea: Secondary | ICD-10-CM

## 2024-01-03 DIAGNOSIS — N179 Acute kidney failure, unspecified: Secondary | ICD-10-CM | POA: Diagnosis not present

## 2024-01-03 LAB — COMPREHENSIVE METABOLIC PANEL WITH GFR
ALT: 46 U/L — ABNORMAL HIGH (ref 0–44)
AST: 24 U/L (ref 15–41)
Albumin: 3.5 g/dL (ref 3.5–5.0)
Alkaline Phosphatase: 67 U/L (ref 38–126)
Anion gap: 11 (ref 5–15)
BUN: 145 mg/dL — ABNORMAL HIGH (ref 8–23)
CO2: 18 mmol/L — ABNORMAL LOW (ref 22–32)
Calcium: 8.8 mg/dL — ABNORMAL LOW (ref 8.9–10.3)
Chloride: 107 mmol/L (ref 98–111)
Creatinine, Ser: 12.8 mg/dL — ABNORMAL HIGH (ref 0.61–1.24)
GFR, Estimated: 4 mL/min — ABNORMAL LOW (ref >60)
Glucose, Bld: 112 mg/dL — ABNORMAL HIGH (ref 70–99)
Potassium: 4.8 mmol/L (ref 3.5–5.1)
Sodium: 136 mmol/L (ref 135–145)
Total Bilirubin: 0.3 mg/dL (ref 0.0–1.2)
Total Protein: 6.6 g/dL (ref 6.5–8.1)

## 2024-01-03 LAB — HIV ANTIBODY (ROUTINE TESTING W REFLEX): HIV Screen 4th Generation wRfx: NONREACTIVE

## 2024-01-03 LAB — RESPIRATORY PANEL BY PCR

## 2024-01-03 LAB — SEDIMENTATION RATE: Sed Rate: 70 mm/h — ABNORMAL HIGH (ref 0–16)

## 2024-01-03 LAB — IRON AND TIBC
Iron: 29 ug/dL — ABNORMAL LOW (ref 45–182)
Saturation Ratios: 11 % — ABNORMAL LOW (ref 17.9–39.5)
TIBC: 255 ug/dL (ref 250–450)
UIBC: 226 ug/dL

## 2024-01-03 LAB — PROTEIN / CREATININE RATIO, URINE
Creatinine, Urine: 51 mg/dL
Protein Creatinine Ratio: 0.31 mg/mg{creat} — ABNORMAL HIGH (ref 0.00–0.15)
Total Protein, Urine: 16 mg/dL

## 2024-01-03 LAB — HEPATITIS PANEL, ACUTE: Hepatitis B Surface Ag: NONREACTIVE

## 2024-01-03 LAB — RPR: RPR Ser Ql: NONREACTIVE

## 2024-01-03 LAB — FERRITIN: Ferritin: 1654 ng/mL — ABNORMAL HIGH (ref 24–336)

## 2024-01-03 MED ORDER — SODIUM CHLORIDE 0.45 % IV SOLN: INTRAVENOUS | Status: DC

## 2024-01-03 MED ORDER — ALBUTEROL SULFATE (2.5 MG/3ML) 0.083% IN NEBU: 2.5000 mg | INHALATION_SOLUTION | RESPIRATORY_TRACT | Status: DC | PRN

## 2024-01-03 MED ORDER — ARFORMOTEROL TARTRATE 15 MCG/2ML IN NEBU
15.0000 ug | INHALATION_SOLUTION | Freq: Two times a day (BID) | RESPIRATORY_TRACT | Status: DC
  Administered 2024-01-03 – 2024-01-07 (×6): 15 ug via RESPIRATORY_TRACT
  Filled 2024-01-03 (×8): qty 2

## 2024-01-03 MED ORDER — BENZONATATE 100 MG PO CAPS
100.0000 mg | ORAL_CAPSULE | Freq: Three times a day (TID) | ORAL | Status: DC
  Administered 2024-01-03 – 2024-01-08 (×15): 100 mg via ORAL
  Filled 2024-01-03 (×15): qty 1

## 2024-01-03 MED ORDER — ACETAMINOPHEN 325 MG PO TABS: 650.0000 mg | ORAL_TABLET | Freq: Four times a day (QID) | ORAL | Status: DC | PRN

## 2024-01-03 MED ORDER — PROCHLORPERAZINE EDISYLATE 10 MG/2ML IJ SOLN: 5.0000 mg | Freq: Four times a day (QID) | INTRAMUSCULAR | Status: DC | PRN

## 2024-01-03 MED ORDER — IPRATROPIUM-ALBUTEROL 0.5-2.5 (3) MG/3ML IN SOLN
3.0000 mL | RESPIRATORY_TRACT | Status: AC
  Administered 2024-01-03: 3 mL via RESPIRATORY_TRACT
  Filled 2024-01-03: qty 3

## 2024-01-03 MED ORDER — GUAIFENESIN ER 600 MG PO TB12
600.0000 mg | ORAL_TABLET | Freq: Two times a day (BID) | ORAL | Status: DC
  Administered 2024-01-03 – 2024-01-08 (×11): 600 mg via ORAL
  Filled 2024-01-03 (×11): qty 1

## 2024-01-03 MED ORDER — MELATONIN 3 MG PO TABS
3.0000 mg | ORAL_TABLET | Freq: Every day | ORAL | Status: DC
  Administered 2024-01-03 – 2024-01-07 (×5): 3 mg via ORAL
  Filled 2024-01-03 (×5): qty 1

## 2024-01-03 MED ORDER — SODIUM CHLORIDE 0.45 % IV SOLN
INTRAVENOUS | Status: AC
  Filled 2024-01-03 (×3): qty 75

## 2024-01-03 MED ORDER — POLYETHYLENE GLYCOL 3350 17 G PO PACK: 17.0000 g | PACK | Freq: Every day | ORAL | Status: DC | PRN

## 2024-01-03 MED ORDER — SODIUM ZIRCONIUM CYCLOSILICATE 10 G PO PACK
10.0000 g | PACK | Freq: Two times a day (BID) | ORAL | Status: AC
  Administered 2024-01-03 (×2): 10 g via ORAL
  Filled 2024-01-03 (×2): qty 1

## 2024-01-03 MED ORDER — IPRATROPIUM-ALBUTEROL 0.5-2.5 (3) MG/3ML IN SOLN
3.0000 mL | Freq: Four times a day (QID) | RESPIRATORY_TRACT | Status: DC
  Administered 2024-01-03 (×2): 3 mL via RESPIRATORY_TRACT
  Filled 2024-01-03: qty 3

## 2024-01-03 MED ORDER — BUDESONIDE 0.25 MG/2ML IN SUSP
0.2500 mg | Freq: Two times a day (BID) | RESPIRATORY_TRACT | Status: DC
  Administered 2024-01-03 – 2024-01-07 (×6): 0.25 mg via RESPIRATORY_TRACT
  Filled 2024-01-03 (×8): qty 2

## 2024-01-03 NOTE — Progress Notes (Signed)
 Echocardiogram 2D Echocardiogram has been performed.  Tyler Mccoy 01/03/2024, 5:58 PM

## 2024-01-03 NOTE — Plan of Care (Signed)
  Problem: Clinical Measurements: Goal: Ability to maintain clinical measurements within normal limits will improve Outcome: Progressing Goal: Will remain free from infection Outcome: Progressing   Problem: Activity: Goal: Risk for activity intolerance will decrease Outcome: Progressing   Problem: Nutrition: Goal: Adequate nutrition will be maintained Outcome: Progressing   Problem: Elimination: Goal: Will not experience complications related to bowel motility Outcome: Progressing Goal: Will not experience complications related to urinary retention Outcome: Progressing   Problem: Pain Managment: Goal: General experience of comfort will improve and/or be controlled Outcome: Progressing   Problem: Safety: Goal: Ability to remain free from injury will improve Outcome: Progressing   Problem: Skin Integrity: Goal: Risk for impaired skin integrity will decrease Outcome: Progressing

## 2024-01-03 NOTE — Progress Notes (Signed)
 [facility] PLAN OF CARE NOTE Patient: Tyler Mccoy BJY:[id number]   PCP: Wyn Heater, MD DOB: [birth year]   DOA: 01/02/2024   DOS: 01/03/2024    Patient was admitted by my colleague earlier on 01/03/2024. I have reviewed the H&P as well as assessment and plan and agree with the same. Important changes in the plan are listed below.  Plan of care: Principal Problem:   Acute renal failure (HCC) Asthma flareup. Avoiding steroids. Continue nebulizer therapy. Monitor.  Appreciate neurology consultation. Renal biopsy recommended.  Level of care: Telemetry Medical Continue telemetry.  Author: Charlean Congress, MD  [facility] 01/03/2024 6:25 PM   If 7PM-7AM, please contact night-coverage at [email or web address]

## 2024-01-03 NOTE — Consult Note (Signed)
 Chief Complaint: Acute kidney injury - image guided percutaneous kidney biopsy   Referring Provider(s): Leandra Pro   Supervising Physician: Creasie Doctor  Patient Status: [facility] - In-pt  History of Present Illness: Tyler Mccoy is a 63 y.o. male with no significant medical history presented to the emergency department 01/02/24 with gross hematuria and left flank pain.  Upon emergency department workup, pt was found to have acute kidney injury and was subsequently admitted.  Interventional radiology was consulted for image guided percutaneous kidney biopsy for further diagnostic workup.    Patient is Full Code  Past Medical History:  Diagnosis Date   Renal disorder    Seasonal allergies     History reviewed. No pertinent surgical history.  Allergies: Flomax [tamsulosin] and Amoxil [amoxicillin]  Medications: Prior to Admission medications   Medication Sig Start Date End Date Taking? Authorizing Provider  Ascorbic Acid (VITAMIN C PO) Take 1 tablet by mouth daily.   Yes [provider]  azelastine (ASTELIN) 0.1 % nasal spray Place 1-2 sprays into both nostrils 2 (two) times daily as needed for rhinitis or allergies.   Yes [provider]  budesonide-formoterol (SYMBICORT) 80-4.5 MCG/ACT inhaler Inhale 2 puffs into the lungs 2 (two) times daily.   Yes [provider]  Calcium Carbonate Antacid (TUMS PO) Take 1 each by mouth daily as needed (upset stomach, nausea). Tums Gummy Bites "Upset Stomach and Nausea Support"   Yes [provider]  Esomeprazole Magnesium (NEXIUM 24HR PO) Take 1 capsule by mouth once.   Yes [provider]  Famotidine (PEPCID PO) Take 1 tablet by mouth once.   Yes [provider]  Ibuprofen-Acetaminophen (ADVIL DUAL ACTION) 125-250 MG TABS Take 2 tablets by mouth 2 (two) times daily as needed (pain).   Yes [provider]  Multiple Vitamins-Minerals (MULTIVITAMIN MEN) TABS Take 1 tablet by  mouth daily.   Yes [provider]  OVER THE COUNTER MEDICATION Take 1 capsule by mouth daily. NatureMade CholestOff   Yes [provider]     Family History  Problem Relation Age of Onset   Asthma Son     Social History   Socioeconomic History   Marital status: Married    Spouse name: Not on file   Number of children: Not on file   Years of education: Not on file   Highest education level: Not on file  Occupational History   Not on file  Tobacco Use   Smoking status: Never   Smokeless tobacco: Never  Vaping Use   Vaping status: Never Used  Substance and Sexual Activity   Alcohol use: Yes    Comment: occasionally   Drug use: Never   Sexual activity: Not on file  Other Topics Concern   Not on file  Social History Narrative   Home with wife   Social Drivers of Health   Financial Resource Strain: Not on file  Food Insecurity: No Food Insecurity (01/02/2024)   Hunger Vital Sign    Worried About Running Out of Food in the Last Year: Never true    Ran Out of Food in the Last Year: Never true  Transportation Needs: No Transportation Needs (01/02/2024)   PRAPARE - Administrator, Civil Service (Medical): No    Lack of Transportation (Non-Medical): No  Physical Activity: Not on file  Stress: Not on file  Social Connections: Not on file     Review of Systems: A 12 point ROS discussed and pertinent  positives are indicated in the HPI above.  All other systems are negative.  Review of Systems  Constitutional:  Negative for fatigue and fever.  HENT:  Negative for congestion and dental problem.   Respiratory:  Negative for cough, chest tightness, shortness of breath and wheezing.   Cardiovascular:  Negative for chest pain.  Gastrointestinal:  Negative for diarrhea, nausea and vomiting.  Neurological:  Negative for light-headedness and headaches.  Psychiatric/Behavioral:  Negative for agitation, behavioral problems and confusion.     Vital  Signs: BP 135/82 (BP Location: Right Arm)   Pulse 73   Temp 97.6 F (36.4 C)   Resp 19   Ht 5\' 5"  (1.651 m)   Wt 139 lb 5.3 oz (63.2 kg)   SpO2 100%   BMI 23.19 kg/m   Advance Care Plan: The advanced care place/surrogate decision maker was discussed at the time of visit and the patient did not wish to discuss or was not able to name a surrogate decision maker or provide an advance care plan.  Physical Exam Constitutional:      Appearance: Normal appearance.  HENT:     Head: Normocephalic and atraumatic.     Mouth/Throat:     Mouth: Mucous membranes are moist.  Cardiovascular:     Rate and Rhythm: Normal rate and regular rhythm.  Pulmonary:     Effort: Pulmonary effort is normal.     Breath sounds: Normal breath sounds.  Abdominal:     General: Bowel sounds are normal.     Palpations: Abdomen is soft.  Musculoskeletal:        General: Normal range of motion.     Cervical back: Normal range of motion.  Skin:    General: Skin is warm and dry.  Neurological:     Mental Status: He is alert and oriented to person, place, and time.  Psychiatric:        Mood and Affect: Mood normal.        Behavior: Behavior normal.     Imaging: CT CHEST ABDOMEN PELVIS WO CONTRAST Result Date: 01/02/2024 CLINICAL DATA:  Left flank pain.  History of kidney stones. EXAM: CT CHEST, ABDOMEN AND PELVIS WITHOUT CONTRAST TECHNIQUE: Multidetector CT imaging of the chest, abdomen and pelvis was performed following the standard protocol without IV contrast. RADIATION DOSE REDUCTION: This exam was performed according to the departmental dose-optimization program which includes automated exposure control, adjustment of the mA and/or kV according to patient size and/or use of iterative reconstruction technique. COMPARISON:  Chest CT dated 07/24/2023 and CT abdomen pelvis dated 08/22/2022. FINDINGS: Evaluation of this exam is limited in the absence of intravenous contrast. CT CHEST FINDINGS Cardiovascular:  There is no cardiomegaly or pericardial effusion. The thoracic aorta and the central pulmonary arteries are grossly unremarkable on this noncontrast CT. Mediastinum/Nodes: No hilar or mediastinal adenopathy. The esophagus and the thyroid gland are grossly unremarkable. No mediastinal fluid collection. Lungs/Pleura: No focal consolidation, pleural effusion, pneumothorax. There is a 5 mm right middle lobe nodule (130/3). The central airways are patent. Musculoskeletal: No acute osseous pathology. CT ABDOMEN PELVIS FINDINGS No intra-abdominal free air or free fluid. Hepatobiliary: The liver is unremarkable. No acute tension. The gallbladder is unremarkable. Pancreas: Unremarkable. No pancreatic ductal dilatation or surrounding inflammatory changes. Spleen: Normal in size without focal abnormality. Adrenals/Urinary Tract: The adrenal glands are unremarkable. The kidneys, visualized ureters, and urinary bladder appear unremarkable. Stomach/Bowel: There is no bowel obstruction or active inflammation. Mild sigmoid diverticulosis. The appendix is normal.  Vascular/Lymphatic: Mild atherosclerotic calcification of the abdominal aorta. The IVC is unremarkable. No portal venous gas. There is no adenopathy. Reproductive: Mildly enlarged prostate gland measuring 5 cm in transverse axial diameter. Other: None Musculoskeletal: No acute or significant osseous findings. IMPRESSION: 1. No acute intrathoracic, abdominal, or pelvic pathology. No hydronephrosis or nephrolithiasis. 2. A 5 mm right middle lobe nodule. No follow-up needed if patient is low-risk.This recommendation follows the consensus statement: Guidelines for Management of Incidental Pulmonary Nodules Detected on CT Images: From the Fleischner Society 2017; Radiology 2017; 284:228-243. 3. Mild sigmoid diverticulosis. No bowel obstruction. Normal appendix. 4.  Aortic Atherosclerosis (ICD10-I70.0). Electronically Signed   By: Angus Bark M.D.   On: 01/02/2024 17:48    DG Chest 2 View Result Date: 01/02/2024 CLINICAL DATA:  sob, labored breathing EXAM: CHEST - 2 VIEW COMPARISON:  October 25, 2017, July 24, 2023 FINDINGS: No focal airspace consolidation, pleural effusion, or pneumothorax. No cardiomegaly. No acute fracture or destructive lesion. Multilevel thoracic osteophytosis. IMPRESSION: No acute cardiopulmonary abnormality. Electronically Signed   By: Rance Burrows M.D.   On: 01/02/2024 16:44    Labs:  CBC: Recent Labs    01/02/24 1447 01/02/24 1731  WBC 10.8*  --   HGB 9.4* 8.5*  HCT 28.1* 25.0*  PLT 312  --     COAGS: No results for input(s): "INR", "APTT" in the last 8760 hours.  BMP: Recent Labs    01/02/24 1447 01/02/24 1731 01/03/24 0610  NA 135 135 136  K 5.6* 5.3* 4.8  CL 98  --  107  CO2 15*  --  18*  GLUCOSE 114*  --  112*  BUN 147*  --  145*  CALCIUM 9.6  --  8.8*  CREATININE 12.30*  --  12.80*  GFRNONAA 4*  --  4*    LIVER FUNCTION TESTS: Recent Labs    01/02/24 1530 01/03/24 0610  BILITOT 0.6 0.3  AST 40 24  ALT 62* 46*  ALKPHOS 102 67  PROT 7.3 6.6  ALBUMIN 4.0 3.5    TUMOR MARKERS: No results for input(s): "AFPTM", "CEA", "CA199", "CHROMGRNA" in the last 8760 hours.  Assessment and Plan:  Pt is with acute kidney injury scheduled for image guided percutaneous kidney biopsy 01/04/24.    Risks and benefits of image guided percutaneous kidney biopsy was discussed with the patient and/or patient's family including, but not limited to bleeding, infection, damage to adjacent structures or low yield requiring additional tests.  All of the questions were answered and there is agreement to proceed.  Consent signed and in chart.   Thank you for allowing our service to participate in Tyler Mccoy 's care.  Electronically Signed: Pasty Bongo, PA-C   01/03/2024, 1:16 PM    I spent a total of 20 Minutes in face to face in clinical consultation, greater than 50% of which was  counseling/coordinating care for image guided percutaneous kidney biopsy.

## 2024-01-03 NOTE — Progress Notes (Signed)
 [facility] Progress Note   Assessment/ Plan:   Assessment/Recommendations: Tyler Mccoy is a/an 63 y.o. male with a past medical history silent reflux with reactive airway disease who present w/ pulmonary wheezing and acute renal failure   Acute Renal Failure: Extensive AKI with normal creatinine about a week ago.  First concern would be dehydration in the setting of recent GI illness as well as NSAID use ultimately leading to tubular injury.  However his history has several clues that are concerning.  Pulmonary disease is somewhat strange and could represent vasculitis.  Also elevated proBNP as well as anemia are concerning and not fully explained.   Despite having a fairly bland urine I think he deserves evaluation for glomerular disease while we hydrate and provide supportive care -Continue with IVFs for now -GN serologies ordered; f/u as they return - ordered biopsy - no indication for HD as of yet but if no improvement we will need to consider- Dr Cindra Cree has discussed this with him too   Wheezing: Chronic issue related to what is felt to be silent reflux.     Anemia: Unclear cause.  No blood loss per patient.  Obtain iron studies.  Transfuse if needed   Elevated proBNP: fairly elevated for an otherwise healthy gentleman. Obtain Echo especially given his lung issues.   Metabolic acidosis: Continue to monitor with hydration   Hyperkalemia: Mild.  Continue to monitor closely   ALT elevation/bilirubin elevation: Mild.  Will continue to monitor  Subjective:    Seen in room.  Making urine, Cr no better.  Discussed with pt and wife- proceed with biopsy.   Objective:   BP 135/82 (BP Location: Right Arm)   Pulse 73   Temp 97.6 F (36.4 C)   Resp 19   Ht 5\' 5"  (1.651 m)   Wt 63.2 kg   SpO2 100%   BMI 23.19 kg/m   Intake/Output Summary (Last 24 hours) at 01/03/2024 1227 Last data filed at 01/03/2024 0818 Gross per 24 hour  Intake 2145.61 ml  Output 650 ml   Net 1495.61 ml   Weight change:   Physical Exam: Gen:NAD CVS: EOMI PERRL Resp: clear Abd: soft Ext: no LE edema  Imaging: CT CHEST ABDOMEN PELVIS WO CONTRAST Result Date: 01/02/2024 CLINICAL DATA:  Left flank pain.  History of kidney stones. EXAM: CT CHEST, ABDOMEN AND PELVIS WITHOUT CONTRAST TECHNIQUE: Multidetector CT imaging of the chest, abdomen and pelvis was performed following the standard protocol without IV contrast. RADIATION DOSE REDUCTION: This exam was performed according to the departmental dose-optimization program which includes automated exposure control, adjustment of the mA and/or kV according to patient size and/or use of iterative reconstruction technique. COMPARISON:  Chest CT dated 07/24/2023 and CT abdomen pelvis dated 08/22/2022. FINDINGS: Evaluation of this exam is limited in the absence of intravenous contrast. CT CHEST FINDINGS Cardiovascular: There is no cardiomegaly or pericardial effusion. The thoracic aorta and the central pulmonary arteries are grossly unremarkable on this noncontrast CT. Mediastinum/Nodes: No hilar or mediastinal adenopathy. The esophagus and the thyroid gland are grossly unremarkable. No mediastinal fluid collection. Lungs/Pleura: No focal consolidation, pleural effusion, pneumothorax. There is a 5 mm right middle lobe nodule (130/3). The central airways are patent. Musculoskeletal: No acute osseous pathology. CT ABDOMEN PELVIS FINDINGS No intra-abdominal free air or free fluid. Hepatobiliary: The liver is unremarkable. No acute tension. The gallbladder is unremarkable. Pancreas: Unremarkable. No pancreatic ductal dilatation or surrounding inflammatory changes. Spleen: Normal in size without focal abnormality. Adrenals/Urinary Tract:  The adrenal glands are unremarkable. The kidneys, visualized ureters, and urinary bladder appear unremarkable. Stomach/Bowel: There is no bowel obstruction or active inflammation. Mild sigmoid diverticulosis. The  appendix is normal. Vascular/Lymphatic: Mild atherosclerotic calcification of the abdominal aorta. The IVC is unremarkable. No portal venous gas. There is no adenopathy. Reproductive: Mildly enlarged prostate gland measuring 5 cm in transverse axial diameter. Other: None Musculoskeletal: No acute or significant osseous findings. IMPRESSION: 1. No acute intrathoracic, abdominal, or pelvic pathology. No hydronephrosis or nephrolithiasis. 2. A 5 mm right middle lobe nodule. No follow-up needed if patient is low-risk.This recommendation follows the consensus statement: Guidelines for Management of Incidental Pulmonary Nodules Detected on CT Images: From the Fleischner Society 2017; Radiology 2017; 284:228-243. 3. Mild sigmoid diverticulosis. No bowel obstruction. Normal appendix. 4.  Aortic Atherosclerosis (ICD10-I70.0). Electronically Signed   By: Angus Bark M.D.   On: 01/02/2024 17:48   DG Chest 2 View Result Date: 01/02/2024 CLINICAL DATA:  sob, labored breathing EXAM: CHEST - 2 VIEW COMPARISON:  October 25, 2017, July 24, 2023 FINDINGS: No focal airspace consolidation, pleural effusion, or pneumothorax. No cardiomegaly. No acute fracture or destructive lesion. Multilevel thoracic osteophytosis. IMPRESSION: No acute cardiopulmonary abnormality. Electronically Signed   By: Rance Burrows M.D.   On: 01/02/2024 16:44    Labs: BMET Recent Labs  Lab 01/02/24 1447 01/02/24 1731 01/03/24 0610  NA 135 135 136  K 5.6* 5.3* 4.8  CL 98  --  107  CO2 15*  --  18*  GLUCOSE 114*  --  112*  BUN 147*  --  145*  CREATININE 12.30*  --  12.80*  CALCIUM 9.6  --  8.8*   CBC Recent Labs  Lab 01/02/24 1447 01/02/24 1731  WBC 10.8*  --   HGB 9.4* 8.5*  HCT 28.1* 25.0*  MCV 97.6  --   PLT 312  --     Medications:     fluticasone furoate-vilanterol  1 puff Inhalation Daily   ipratropium-albuterol  3 mL Nebulization Q6H   melatonin  3 mg Oral QHS   sodium zirconium cyclosilicate  10 g Oral BID     Leandra Pro MD 01/03/2024, 12:27 PM

## 2024-01-03 NOTE — H&P (Addendum)
 History and Physical  Tyler Mccoy MVH:[id number] DOB: [birth year] DOA: 01/02/2024  Referring physician: Accepted by Dr. Yvonne Hering [facility], hospitalist service. PCP: Wyn Heater, MD  Outpatient Specialists: Pulmonary, urology. Patient coming from: Home.  Chief Complaint: Shortness of breath, left flank pain.  HPI: Tyler Mccoy is a 63 y.o. male avid runner with medical history significant for asthma, GERD, who presents to the ER with complaints of worsening shortness of breath.  Also endorses left flank pain for the past 2 weeks.  Has been taking ibuprofen and acetaminophen as needed for his left flank pain for the past 2 weeks.  Denies taking NSAIDs excessively.  He had an episode of hematuria 1 week ago after running.  Also endorses poor oral intake since last Friday when he developed stomachache with nausea and vomiting.  States he was diagnosed with elevated PSA and had a prostate biopsy 2 years ago which was benign.  Was recently started on Flomax due to suspected kidney stones.  He stopped taking Flomax due to undesirable side effects.  In the ER, conversational dyspnea is noted on exam.  His lab studies were concerning for elevated creatinine 12.30 from 1.06 on 12/28/2023.  BUN 147 from 13 on 12/28/2023.  proBNP was elevated at 933.  Troponin 32, 30.  The patient had a CT chest abdomen pelvis without contrast which revealed no acute intrathoracic, abdominal or pelvic pathology.  No hydronephrosis or nephrolithiasis.  5 mm right middle lobe nodule.  No follow-up needed if patient is low risk.  Aortic atherosclerosis.  EDP discussed the case with nephrology who recommended admission for further management of severe AKI.  Admitted by [facility], hospitalist service and transferred to [facility] telemetry medical unit as inpatient status.  ED Course: Temperature 98.4.  BP 155/78, pulse 79, respiratory 18, O2 saturation 100% on room air.  Lab studies notable for potassium 5.6, BUN 147,  creatinine 3.30, serum bicarb 15, anion gap 22, GFR 4, troponin 32, 30.  proBNP 933.  6 to.  AST 40.  Ferritin 1654.  Saturation ratio was 11.  Iron 29.  CRP 6.1.  Sed rate 70.  Review of Systems: Review of systems as noted in the HPI. All other systems reviewed and are negative.   Past Medical History:  Diagnosis Date   Renal disorder    Seasonal allergies    History reviewed. No pertinent surgical history.  Social History:  reports that he has never smoked. He has never used smokeless tobacco. He reports current alcohol use. He reports that he does not use drugs.   Allergies  Allergen Reactions   Amoxicillin Rash    Family History  Problem Relation Age of Onset   Asthma Son       Prior to Admission medications   Medication Sig Start Date End Date Taking? Authorizing Provider  albuterol (PROVENTIL HFA;VENTOLIN HFA) 108 (90 Base) MCG/ACT inhaler Inhale 1-2 puffs into the lungs every 6 (six) hours as needed for wheezing or shortness of breath.    [provider]  Ascorbic Acid (VITAMIN C) 100 MG tablet Take 1,000 mg by mouth daily.     [provider]  budesonide-formoterol (SYMBICORT) 160-4.5 MCG/ACT inhaler Inhale 2 puffs into the lungs 2 (two) times daily.    [provider]  cetirizine (ZYRTEC) 10 MG tablet Take 10 mg by mouth daily.    [provider]  ipratropium (ATROVENT ) 0.06 % nasal spray Place 2 sprays into both nostrils 4 (four) times daily as needed for rhinitis.  11/15/17   Marine Sia, MD  Multiple Vitamin (MULTIVITAMIN) capsule Take 1 capsule by mouth daily.    [provider]  omega-3 acid ethyl esters (LOVAZA) 1 G capsule Take 1 g by mouth 2 (two) times daily.    [provider]  ranitidine (ZANTAC) 150 MG tablet Take 150 mg by mouth at bedtime. 03/21/18   [provider]  Respiratory Therapy Supplies (FLUTTER) DEVI Use as directed. 11/15/17   Marine Sia, MD    Physical Exam: BP (!)  155/78 (BP Location: Right Arm)   Pulse 79   Temp 98.4 F (36.9 C) (Oral)   Resp 18   Ht 5\' 5"  (1.651 m)   Wt 62.4 kg   SpO2 98%   BMI 22.89 kg/m   General: 63 y.o. year-old male well developed well nourished in no acute distress.  Alert and oriented x3. Cardiovascular: Regular rate and rhythm with no rubs or gallops.  No thyromegaly or JVD noted.  No lower extremity edema. 2/4 pulses in all 4 extremities. Respiratory: Mild wheezing diffusely. Good inspiratory effort. Abdomen: Soft nontender nondistended with normal bowel sounds x4 quadrants. Muskuloskeletal: No cyanosis, clubbing or edema noted bilaterally Neuro: CN II-XII intact, strength, sensation, reflexes Skin: No ulcerative lesions noted or rashes Psychiatry: Judgement and insight appear normal. Mood is appropriate for condition and setting          Labs on Admission:  Basic Metabolic Panel: Recent Labs  Lab 01/02/24 1447 01/02/24 1731  NA 135 135  K 5.6* 5.3*  CL 98  --   CO2 15*  --   GLUCOSE 114*  --   BUN 147*  --   CREATININE 12.30*  --   CALCIUM 9.6  --    Liver Function Tests: Recent Labs  Lab 01/02/24 1530  AST 40  ALT 62*  ALKPHOS 102  BILITOT 0.6  PROT 7.3  ALBUMIN 4.0   No results for input(s): "LIPASE", "AMYLASE" in the last 168 hours. No results for input(s): "AMMONIA" in the last 168 hours. CBC: Recent Labs  Lab 01/02/24 1447 01/02/24 1731  WBC 10.8*  --   HGB 9.4* 8.5*  HCT 28.1* 25.0*  MCV 97.6  --   PLT 312  --    Cardiac Enzymes: Recent Labs  Lab 01/02/24 1706  CKTOTAL 49    BNP (last 3 results) No results for input(s): "BNP" in the last 8760 hours.  ProBNP (last 3 results) Recent Labs    01/02/24 1706  PROBNP 933.0*    CBG: No results for input(s): "GLUCAP" in the last 168 hours.  Radiological Exams on Admission: CT CHEST ABDOMEN PELVIS WO CONTRAST Result Date: 01/02/2024 CLINICAL DATA:  Left flank pain.  History of kidney stones. EXAM: CT CHEST, ABDOMEN AND  PELVIS WITHOUT CONTRAST TECHNIQUE: Multidetector CT imaging of the chest, abdomen and pelvis was performed following the standard protocol without IV contrast. RADIATION DOSE REDUCTION: This exam was performed according to the departmental dose-optimization program which includes automated exposure control, adjustment of the mA and/or kV according to patient size and/or use of iterative reconstruction technique. COMPARISON:  Chest CT dated 07/24/2023 and CT abdomen pelvis dated 08/22/2022. FINDINGS: Evaluation of this exam is limited in the absence of intravenous contrast. CT CHEST FINDINGS Cardiovascular: There is no cardiomegaly or pericardial effusion. The thoracic aorta and the central pulmonary arteries are grossly unremarkable on this noncontrast CT. Mediastinum/Nodes: No hilar or mediastinal adenopathy. The esophagus and the thyroid gland are grossly unremarkable. No  mediastinal fluid collection. Lungs/Pleura: No focal consolidation, pleural effusion, pneumothorax. There is a 5 mm right middle lobe nodule (130/3). The central airways are patent. Musculoskeletal: No acute osseous pathology. CT ABDOMEN PELVIS FINDINGS No intra-abdominal free air or free fluid. Hepatobiliary: The liver is unremarkable. No acute tension. The gallbladder is unremarkable. Pancreas: Unremarkable. No pancreatic ductal dilatation or surrounding inflammatory changes. Spleen: Normal in size without focal abnormality. Adrenals/Urinary Tract: The adrenal glands are unremarkable. The kidneys, visualized ureters, and urinary bladder appear unremarkable. Stomach/Bowel: There is no bowel obstruction or active inflammation. Mild sigmoid diverticulosis. The appendix is normal. Vascular/Lymphatic: Mild atherosclerotic calcification of the abdominal aorta. The IVC is unremarkable. No portal venous gas. There is no adenopathy. Reproductive: Mildly enlarged prostate gland measuring 5 cm in transverse axial diameter. Other: None Musculoskeletal: No  acute or significant osseous findings. IMPRESSION: 1. No acute intrathoracic, abdominal, or pelvic pathology. No hydronephrosis or nephrolithiasis. 2. A 5 mm right middle lobe nodule. No follow-up needed if patient is low-risk.This recommendation follows the consensus statement: Guidelines for Management of Incidental Pulmonary Nodules Detected on CT Images: From the Fleischner Society 2017; Radiology 2017; 284:228-243. 3. Mild sigmoid diverticulosis. No bowel obstruction. Normal appendix. 4.  Aortic Atherosclerosis (ICD10-I70.0). Electronically Signed   By: Angus Bark M.D.   On: 01/02/2024 17:48   DG Chest 2 View Result Date: 01/02/2024 CLINICAL DATA:  sob, labored breathing EXAM: CHEST - 2 VIEW COMPARISON:  October 25, 2017, July 24, 2023 FINDINGS: No focal airspace consolidation, pleural effusion, or pneumothorax. No cardiomegaly. No acute fracture or destructive lesion. Multilevel thoracic osteophytosis. IMPRESSION: No acute cardiopulmonary abnormality. Electronically Signed   By: Rance Burrows M.D.   On: 01/02/2024 16:44    EKG: I independently viewed the EKG done and my findings are as followed: Sinus rhythm rate of 77.  Nonspecific ST-T changes.  QTc 406.  Assessment/Plan Present on Admission:  Acute renal failure (HCC)  Principal Problem:   Acute renal failure (HCC)  AKI, suspect multifactorial Presented with creatinine of 12.30 and BUN of 147 from normal renal function less than a week ago. Started on gentle IV fluid hydration Avoid hepatotoxic agents, avoid NSAIDs, avoid hypotension, and dehydration. Follow postvoid bladder scan Closely monitor urine output No evidence of hydronephrosis or nephrolithiasis seen on CT scan Follow UA Nephrology consulted and following  Hyperkalemia in the setting of acute renal insufficiency Presented with serum potassium 5.6, downtrending to 5.3 Continue Lokelma 10 g x 2 Repeat CMP in the morning  Elevated troponin, suspect demand  ischemia in the setting of dyspnea and severe acute renal insufficiency Elevated BNP Troponin peaked at 32 and down-trended Follow 2D echo to rule out any cardiac structural abnormalities Monitor strict I's and O's and daily weight  Isolated elevated ALT Monitor for now Repeat CMP in the morning.  Anemia of chronic disease Suspect anemia is contributed by severe acute renal insufficiency FOBT was negative on 01/02/2024 As reported 1 episode of hematuria a week ago that has now resolved Hemoglobin 8.5 from 9.4.  Remote hematuria Endorses an episode of hematuria a week ago after running. This also happened 2 years ago after running. CPK is currently within normal range.  Left flank pain, improved No findings on CT scan to explain it Continue supportive care Pain is currently 2 out of 10 Follow UA  Elevated inflammatory markers Sed rate 70, CRP 6.1 Rule out autoimmune cause of acute renal failure. Nephrology following  Asthma Resume home bronchodilators As needed nebulizers  Incidental finding  of pulmonary nodule, 5 mm, seen on CT scan Never smoker Follow-up with pulmonary outpatient    Critical care time: 55 minutes.     DVT prophylaxis: SCDs.  Code Status: Full code.  Family Communication: None at bedside.  Disposition Plan: Admitted to telemetry medical unit.  Consults called: None.  Admission status: Inpatient status.   Status is: Inpatient The patient requires at least 2 midnights for further evaluation and treatment of present condition.   Bary Boss MD [facility] Pager [phone]  If 7PM-7AM, please contact night-coverage [email or web address] Password [email or web address]  01/03/2024, 3:24 AM

## 2024-01-04 ENCOUNTER — Inpatient Hospital Stay (HOSPITAL_COMMUNITY)

## 2024-01-04 DIAGNOSIS — N179 Acute kidney failure, unspecified: Secondary | ICD-10-CM | POA: Diagnosis not present

## 2024-01-04 DIAGNOSIS — E785 Hyperlipidemia, unspecified: Secondary | ICD-10-CM | POA: Diagnosis present

## 2024-01-04 DIAGNOSIS — J4540 Moderate persistent asthma, uncomplicated: Secondary | ICD-10-CM | POA: Diagnosis present

## 2024-01-04 DIAGNOSIS — H1045 Other chronic allergic conjunctivitis: Secondary | ICD-10-CM | POA: Insufficient documentation

## 2024-01-04 DIAGNOSIS — J309 Allergic rhinitis, unspecified: Secondary | ICD-10-CM | POA: Insufficient documentation

## 2024-01-04 LAB — GLOMERULAR BASEMENT MEMBRANE ANTIBODIES: GBM Ab: <0.2 U (ref 0.0–0.9)

## 2024-01-04 LAB — CBC WITH DIFFERENTIAL/PLATELET
Abs Immature Granulocytes: 0.31 10*3/uL — ABNORMAL HIGH (ref 0.00–0.07)
Basophils Absolute: 0.1 10*3/uL (ref 0.0–0.1)
Basophils Relative: 1 %
Eosinophils Absolute: 0.8 10*3/uL — ABNORMAL HIGH (ref 0.0–0.5)
Eosinophils Relative: 10 %
HCT: 21.9 % — ABNORMAL LOW (ref 39.0–52.0)
Hemoglobin: 7.3 g/dL — ABNORMAL LOW (ref 13.0–17.0)
Immature Granulocytes: 4 %
Lymphocytes Relative: 20 %
Lymphs Abs: 1.7 10*3/uL (ref 0.7–4.0)
MCH: 32.3 pg (ref 26.0–34.0)
MCHC: 33.3 g/dL (ref 30.0–36.0)
MCV: 96.9 fL (ref 80.0–100.0)
Monocytes Absolute: 1 10*3/uL (ref 0.1–1.0)
Monocytes Relative: 11 %
Neutro Abs: 4.7 10*3/uL (ref 1.7–7.7)
Neutrophils Relative %: 54 %
Platelets: 299 10*3/uL (ref 150–400)
RBC: 2.26 MIL/uL — ABNORMAL LOW (ref 4.22–5.81)
RDW: 12.3 % (ref 11.5–15.5)
WBC: 8.6 10*3/uL (ref 4.0–10.5)
nRBC: 0 % (ref 0.0–0.2)

## 2024-01-04 LAB — COMPREHENSIVE METABOLIC PANEL WITH GFR
ALT: 34 U/L (ref 0–44)
AST: 21 U/L (ref 15–41)
Albumin: 3.1 g/dL — ABNORMAL LOW (ref 3.5–5.0)
Alkaline Phosphatase: 56 U/L (ref 38–126)
Anion gap: 12 (ref 5–15)
BUN: 135 mg/dL — ABNORMAL HIGH (ref 8–23)
CO2: 18 mmol/L — ABNORMAL LOW (ref 22–32)
Calcium: 8 mg/dL — ABNORMAL LOW (ref 8.9–10.3)
Chloride: 104 mmol/L (ref 98–111)
Creatinine, Ser: 12.99 mg/dL — ABNORMAL HIGH (ref 0.61–1.24)
GFR, Estimated: 4 mL/min — ABNORMAL LOW (ref >60)
Glucose, Bld: 108 mg/dL — ABNORMAL HIGH (ref 70–99)
Potassium: 4.4 mmol/L (ref 3.5–5.1)
Sodium: 134 mmol/L — ABNORMAL LOW (ref 135–145)
Total Bilirubin: 0.5 mg/dL (ref 0.0–1.2)
Total Protein: 5.7 g/dL — ABNORMAL LOW (ref 6.5–8.1)

## 2024-01-04 LAB — PROTIME-INR
INR: 1.1 (ref 0.8–1.2)
Prothrombin Time: 14.5 s (ref 11.4–15.2)

## 2024-01-04 LAB — KAPPA/LAMBDA LIGHT CHAINS
Kappa free light chain: 83.1 mg/L — ABNORMAL HIGH (ref 3.3–19.4)
Kappa, lambda light chain ratio: 1.52 (ref 0.26–1.65)
Lambda free light chains: 54.5 mg/L — ABNORMAL HIGH (ref 5.7–26.3)

## 2024-01-04 LAB — PROTEIN ELECTROPHORESIS, SERUM
A/G Ratio: 1.1 (ref 0.7–1.7)
Albumin ELP: 3.4 g/dL (ref 2.9–4.4)
Alpha-1-Globulin: 0.4 g/dL (ref 0.0–0.4)
Alpha-2-Globulin: 0.6 g/dL (ref 0.4–1.0)
Beta Globulin: 1 g/dL (ref 0.7–1.3)
Gamma Globulin: 1 g/dL (ref 0.4–1.8)
Globulin, Total: 3 g/dL (ref 2.2–3.9)
Total Protein ELP: 6.4 g/dL (ref 6.0–8.5)

## 2024-01-04 LAB — ANCA PROFILE
Anti-MPO Antibodies: <0.2 U (ref 0.0–0.9)
Anti-PR3 Antibodies: <0.2 U (ref 0.0–0.9)
Atypical P-ANCA titer: <1:20 {titer}
C-ANCA: <1:20 {titer}
P-ANCA: <1:20 {titer}

## 2024-01-04 LAB — ENA+DNA/DS+ANTICH+CENTRO+JO...
Anti JO-1: <0.2 AI (ref 0.0–0.9)
Centromere Ab Screen: <0.2 AI (ref 0.0–0.9)
Chromatin Ab SerPl-aCnc: <0.2 AI (ref 0.0–0.9)
ENA SM Ab Ser-aCnc: <0.2 AI (ref 0.0–0.9)
Ribonucleic Protein: 1.2 AI — ABNORMAL HIGH (ref 0.0–0.9)
SSA (Ro) (ENA) Antibody, IgG: 0.2 AI (ref 0.0–0.9)
SSB (La) (ENA) Antibody, IgG: <0.2 AI (ref 0.0–0.9)
Scleroderma (Scl-70) (ENA) Antibody, IgG: <0.2 AI (ref 0.0–0.9)
ds DNA Ab: <1 [IU]/mL (ref 0–9)

## 2024-01-04 LAB — MAGNESIUM: Magnesium: 2.4 mg/dL (ref 1.7–2.4)

## 2024-01-04 LAB — PROCALCITONIN: Procalcitonin: 2.54 ng/mL

## 2024-01-04 LAB — PHOSPHORUS: Phosphorus: 7.6 mg/dL — ABNORMAL HIGH (ref 2.5–4.6)

## 2024-01-04 LAB — ANA W/REFLEX IF POSITIVE: Anti Nuclear Antibody (ANA): POSITIVE — AB

## 2024-01-04 MED ORDER — MIDAZOLAM HCL 2 MG/2ML IJ SOLN
INTRAMUSCULAR | Status: AC
Start: 2024-01-04 — End: ?
  Filled 2024-01-04: qty 2

## 2024-01-04 MED ORDER — FENTANYL CITRATE (PF) 100 MCG/2ML IJ SOLN
INTRAMUSCULAR | Status: AC | PRN
  Administered 2024-01-04: 50 ug via INTRAVENOUS

## 2024-01-04 MED ORDER — FENTANYL CITRATE (PF) 100 MCG/2ML IJ SOLN
INTRAMUSCULAR | Status: AC
  Filled 2024-01-04: qty 2

## 2024-01-04 MED ORDER — MIDAZOLAM HCL 2 MG/2ML IJ SOLN
INTRAMUSCULAR | Status: AC | PRN
  Administered 2024-01-04 (×2): 1 mg via INTRAVENOUS

## 2024-01-04 MED ORDER — GELATIN ABSORBABLE 12-7 MM EX MISC
1.0000 | Freq: Once | CUTANEOUS | Status: AC
  Administered 2024-01-04: 1 via TOPICAL
  Filled 2024-01-04: qty 1

## 2024-01-04 MED ORDER — LIDOCAINE HCL (PF) 1 % IJ SOLN
10.0000 mL | Freq: Once | INTRAMUSCULAR | Status: AC
  Administered 2024-01-04: 10 mL via INTRADERMAL
  Filled 2024-01-04: qty 10

## 2024-01-04 NOTE — Progress Notes (Signed)
 [facility] Progress Note   Assessment/ Plan:   Assessment/Recommendations: Tyler Mccoy is a/an 63 y.o. male with a past medical history silent reflux with reactive airway disease who present w/ pulmonary wheezing and acute renal failure   Acute Renal Failure: Extensive AKI with normal creatinine about a week ago.  First concern would be dehydration in the setting of recent GI illness as well as NSAID use ultimately leading to tubular injury.  However his history has several clues that are concerning.  Pulmonary disease is somewhat strange and could represent vasculitis.  Also elevated proBNP as well as anemia are concerning and not fully explained.   Despite having a fairly bland urine I think he deserves evaluation for glomerular disease while we hydrate and provide supportive care -Continue with IVFs for now -GN serologies ordered; f/u as they return--> ANA +, anti-RNP + at 1.2 - ordered biopsy- performed 01/04/24 - no indication for HD as of yet but if no improvement we will need to consider- Dr Cindra Cree has discussed this with him too   Wheezing: Chronic issue related to what is felt to be silent reflux.  Better today- is    Anemia: Unclear cause.  No blood loss per patient.  Obtain iron studies.  Transfuse if needed   Elevated proBNP: fairly elevated for an otherwise healthy gentleman. Obtain Echo especially given his lung issues.   Metabolic acidosis: Continue to monitor with hydration   Hyperkalemia: Mild.  Continue to monitor closely   ALT elevation/bilirubin elevation: Mild.  Will continue to monitor  Subjective:    S/p biopsy today.  BUN marginally better.  Wheezing better   Objective:   BP 120/69 (BP Location: Right Arm)   Pulse 76   Temp 98.1 F (36.7 C)   Resp 18   Ht 5\' 5"  (1.651 m)   Wt 63.2 kg   SpO2 99%   BMI 23.19 kg/m   Intake/Output Summary (Last 24 hours) at 01/04/2024 1417 Last data filed at 01/04/2024 1024 Gross per 24 hour  Intake  1220.53 ml  Output 1670 ml  Net -449.47 ml   Weight change:   Physical Exam: Gen:NAD CVS: EOMI PERRL Resp: clear Abd: soft Ext: no LE edema  Imaging: US  BIOPSY (KIDNEY) Result Date: 01/04/2024 INDICATION: Acute kidney injury EXAM: Ultrasound-guided renal biopsy MEDICATIONS: None. ANESTHESIA/SEDATION: Moderate (conscious) sedation was employed during this procedure. A total of Versed 2 mg and Fentanyl 50 mcg was administered intravenously by the radiology nurse. Total intra-service moderate Sedation Time: 18 minutes. The patient's level of consciousness and vital signs were monitored continuously by radiology nursing throughout the procedure under my direct supervision. COMPLICATIONS: None immediate. PROCEDURE: Informed written consent was obtained from the patient after a thorough discussion of the procedural risks, benefits and alternatives. All questions were addressed. Maximal Sterile Barrier Technique was utilized including caps, mask, sterile gowns, sterile gloves, sterile drape, hand hygiene and skin antiseptic. A timeout was performed prior to the initiation of the procedure. With the patient in a prone position, both like regions for scanned with ultrasound in order to evaluate multiple target region within the best visualized kidney. The left kidney was chosen as the best visualized. The left flank was prepped and draped in usual sterile fashion. Local anesthesia was achieved with 1% lidocaine. Lidocaine was administered by infiltrating the skin to the level of the renal capsule of under ultrasound guidance by bands in the needle and injected lidocaine. A small incision was made patient's flight and an introducer  needle was advanced to the lower pole left kidney. Once in position of the needle was removed leaving the cannula behind for access to the left kidney. The bio Pince biopsy needle was then advanced under ultrasound guidance through the cannula and into the lower pole. This was  performed twice and 218 gauge cores were obtained of the renal parenchyma. Gel-Foam slurry was injected through the cannula the cannula was removed the patient. Sterile dressing was applied. Final image demonstrates no active hemorrhage. IMPRESSION: Successful core needle biopsy of the lower pole left kidney. Electronically Signed   By: Susan Ensign   On: 01/04/2024 12:47   CT CHEST ABDOMEN PELVIS WO CONTRAST Result Date: 01/02/2024 CLINICAL DATA:  Left flank pain.  History of kidney stones. EXAM: CT CHEST, ABDOMEN AND PELVIS WITHOUT CONTRAST TECHNIQUE: Multidetector CT imaging of the chest, abdomen and pelvis was performed following the standard protocol without IV contrast. RADIATION DOSE REDUCTION: This exam was performed according to the departmental dose-optimization program which includes automated exposure control, adjustment of the mA and/or kV according to patient size and/or use of iterative reconstruction technique. COMPARISON:  Chest CT dated 07/24/2023 and CT abdomen pelvis dated 08/22/2022. FINDINGS: Evaluation of this exam is limited in the absence of intravenous contrast. CT CHEST FINDINGS Cardiovascular: There is no cardiomegaly or pericardial effusion. The thoracic aorta and the central pulmonary arteries are grossly unremarkable on this noncontrast CT. Mediastinum/Nodes: No hilar or mediastinal adenopathy. The esophagus and the thyroid gland are grossly unremarkable. No mediastinal fluid collection. Lungs/Pleura: No focal consolidation, pleural effusion, pneumothorax. There is a 5 mm right middle lobe nodule (130/3). The central airways are patent. Musculoskeletal: No acute osseous pathology. CT ABDOMEN PELVIS FINDINGS No intra-abdominal free air or free fluid. Hepatobiliary: The liver is unremarkable. No acute tension. The gallbladder is unremarkable. Pancreas: Unremarkable. No pancreatic ductal dilatation or surrounding inflammatory changes. Spleen: Normal in size without focal  abnormality. Adrenals/Urinary Tract: The adrenal glands are unremarkable. The kidneys, visualized ureters, and urinary bladder appear unremarkable. Stomach/Bowel: There is no bowel obstruction or active inflammation. Mild sigmoid diverticulosis. The appendix is normal. Vascular/Lymphatic: Mild atherosclerotic calcification of the abdominal aorta. The IVC is unremarkable. No portal venous gas. There is no adenopathy. Reproductive: Mildly enlarged prostate gland measuring 5 cm in transverse axial diameter. Other: None Musculoskeletal: No acute or significant osseous findings. IMPRESSION: 1. No acute intrathoracic, abdominal, or pelvic pathology. No hydronephrosis or nephrolithiasis. 2. A 5 mm right middle lobe nodule. No follow-up needed if patient is low-risk.This recommendation follows the consensus statement: Guidelines for Management of Incidental Pulmonary Nodules Detected on CT Images: From the Fleischner Society 2017; Radiology 2017; 284:228-243. 3. Mild sigmoid diverticulosis. No bowel obstruction. Normal appendix. 4.  Aortic Atherosclerosis (ICD10-I70.0). Electronically Signed   By: Angus Bark M.D.   On: 01/02/2024 17:48   DG Chest 2 View Result Date: 01/02/2024 CLINICAL DATA:  sob, labored breathing EXAM: CHEST - 2 VIEW COMPARISON:  October 25, 2017, July 24, 2023 FINDINGS: No focal airspace consolidation, pleural effusion, or pneumothorax. No cardiomegaly. No acute fracture or destructive lesion. Multilevel thoracic osteophytosis. IMPRESSION: No acute cardiopulmonary abnormality. Electronically Signed   By: Rance Burrows M.D.   On: 01/02/2024 16:44    Labs: BMET Recent Labs  Lab 01/02/24 1447 01/02/24 1731 01/03/24 0610 01/04/24 0549  NA 135 135 136 134*  K 5.6* 5.3* 4.8 4.4  CL 98  --  107 104  CO2 15*  --  18* 18*  GLUCOSE 114*  --  112* 108*  BUN 147*  --  145* 135*  CREATININE 12.30*  --  12.80* 12.99*  CALCIUM 9.6  --  8.8* 8.0*  PHOS  --   --   --  7.6*   CBC Recent  Labs  Lab 01/02/24 1447 01/02/24 1731 01/04/24 0549  WBC 10.8*  --  8.6  NEUTROABS  --   --  4.7  HGB 9.4* 8.5* 7.3*  HCT 28.1* 25.0* 21.9*  MCV 97.6  --  96.9  PLT 312  --  299    Medications:     arformoterol   15 mcg Nebulization BID   benzonatate   100 mg Oral TID   budesonide  (PULMICORT ) nebulizer solution  0.25 mg Nebulization BID   fluticasone  furoate-vilanterol  1 puff Inhalation Daily   guaiFENesin   600 mg Oral BID   melatonin  3 mg Oral QHS    Leandra Pro MD 01/04/2024, 2:17 PM

## 2024-01-04 NOTE — Hospital Course (Addendum)
 Patient with PMH of asthma as well as allergic rhinitis presents to the hospital with complaints of shortness of breath.  Also had some chest pain. Patient is a runner.  Was training for a race.  Parents few miles a day. Reports that on Thursday 5/22 he ran extensively.  Overnight he had some blood in the urine.  He felt tired on the Friday.  Started having pain in his back which he felt was likely due to him passing a kidney stone.  Went to see PCP.  Blood work was done which was unremarkable including lipid panel.  Serum creatinine back down was 1.26.  Was recommended to hydrate. Started with shortness of breath and went to see PCP again on 5/28 and had some chest pain and was recommended to go to hospital for concern for PE. Found to have severe AKI/ARF.  Nephrology consulted.  Underwent renal biopsy. Assessment and Plan: Acute renal failure. On presentation serum creatinine 12.30 and BUN 147. Renal function on 5/22 serum creatinine 1.06. Nephrology consulted Took a dose of NSAID, had some blood in the urine on 5/22.  Actively training for a race for last few months. Initiated on IV fluid without any significant improvement in serum creatinine or BUN. Currently no indication for HD. Underwent renal biopsy.  Results pending. ANA positive.  RNP positive.  Warm antibodies in blood. Monitor.  Acute anemia. Hemoglobin on admission was 9.4. Hemoglobin dropping likely combination of dilution.  There is concern for hemolysis given presence of warm antibodies as well as concern for marrow suppression in the setting of renal failure. Receiving iron receiving EPO. Blood transfusion ordered although due to presence of antibody difficult to find a manage as of right now. Patient is minimally symptomatic for now. Continue to monitor H&H. Transfuse for hemoglobin less than 7 or hemodynamic instability. Appreciate discussion with hematology.  In an event if the patient requires a blood transfusion,  pretreatment with Pepcid, Benadryl Tylenol  and steroid would be recommended for transfusion.  Mild hyperkalemia. Treated with IV fluid and Lokelma . Monitor.  Elevated troponin. Likely secondary to poor clearance. No evidence of demand ischemia. Monitor.  Left flank pain. Currently resolved. CT scan unremarkable. Monitor.  Acute asthma flareup. Bilateral expiratory wheezing on examination. Breathing improved with change in nebulizer therapy. Discussed with nephrology.  Okay to start steroids.  Will initiate at 50 mg oral prednisone.  Chest x-ray unremarkable.  Incidental pulmonary nodule. Repeat outpatient per PCP.

## 2024-01-04 NOTE — Progress Notes (Signed)
 [facility] Progress Note Patient: Tyler Mccoy ZOX:[id number] DOB: 1960/11/20 DOA: 01/02/2024  DOS: the patient was seen and examined on 01/04/2024  Brief Hospital Course: Patient with PMH of asthma as well as allergic rhinitis presents to the hospital with complaints of shortness of breath.  Also had some chest pain. Patient is a runner.  Was training for a race.  Parents few miles a day. Reports that on Thursday 5/22 he ran extensively.  Overnight he had some blood in the urine.  He felt tired on the Friday.  Started having pain in his back which he felt was likely due to him passing a kidney stone.  Went to see PCP.  Blood work was done which was unremarkable including lipid panel.  Serum creatinine back down was 1.26.  Was recommended to hydrate. Started with shortness of breath and went to see PCP again on 5/28 and had some chest pain and was recommended to go to hospital for concern for PE. Found to have severe AKI/ARF.  Nephrology consulted.  Underwent renal biopsy.  Assessment and Plan: Acute renal failure. On presentation serum creatinine 12.30 and BUN 147. Renal function on 5/22 serum creatinine 1.06. Took a dose of NSAID, had some blood in the urine on 5/22.  Actively training for a race for last few months. Initiated on IV fluid without any significant improvement in serum creatinine or BUN. Currently no indication for HD. Nephrology consulted Underwent renal biopsy. ANA positive.  RNP positive. Monitor.  Acute anemia. Hemoglobin on admission was 9.4. Hemoglobin currently 7.3. Iron level 29, ferritin 1654, D-dimer normal, INR normal. Patient has provided consent for transfusion Transfuse for hemoglobin less than 7 or hemodynamic instability.  Mild hyperkalemia. Treated with IV fluid and Lokelma. Monitor.  Elevated troponin. Likely secondary to poor clearance. No evidence of demand ischemia. Monitor.  Left flank pain. Currently resolved. CT scan  unremarkable. Monitor.  Acute asthma flareup. Bilateral expiratory wheezing on examination. Breathing improved with change in nebulizer therapy. Currently holding off on steroids.  Incidental pulmonary nodule. Repeat outpatient per PCP.    Subjective: No nausea no vomiting no fever no chills.  Ongoing shortness of breath improving.  Physical Exam: General: in Mild distress, No Rash Cardiovascular: S1 and S2 Present, No Murmur Respiratory: Good respiratory effort, Bilateral Air entry present. No Crackles, improving bilateral expiratory wheezes Abdomen: Bowel Sound present, No tenderness Extremities: No edema Neuro: Alert and oriented x3, no new focal deficit  Data Reviewed: I have Reviewed nursing notes, Vitals, and Lab results. Since last encounter, pertinent lab results CBC and BMP   . I have ordered test including CBC and BMP  .   Disposition: Status is: Inpatient Remains inpatient appropriate because: Monitor for improvement in renal function and further workup  SCDs Start: 01/03/24 0015   Family Communication: No one at bedside Level of care: Telemetry Medical   Vitals:   01/04/24 1035 01/04/24 1040 01/04/24 1045 01/04/24 1100  BP: 126/67 114/72 119/67 120/69  Pulse: 69 65 66 76  Resp: 12 11 12 18   Temp:      TempSrc:      SpO2: 99% 100% 100% 99%  Weight:      Height:         Author: Charlean Congress, MD 01/04/2024 7:01 PM  Please look on [email or web address] to find out who is on call.

## 2024-01-04 NOTE — Plan of Care (Signed)
  Problem: Clinical Measurements: Goal: Ability to maintain clinical measurements within normal limits will improve Outcome: Progressing Goal: Will remain free from infection Outcome: Progressing   Problem: Activity: Goal: Risk for activity intolerance will decrease Outcome: Progressing   Problem: Nutrition: Goal: Adequate nutrition will be maintained Outcome: Progressing   Problem: Elimination: Goal: Will not experience complications related to bowel motility Outcome: Progressing Goal: Will not experience complications related to urinary retention Outcome: Progressing   Problem: Pain Managment: Goal: General experience of comfort will improve and/or be controlled Outcome: Progressing   Problem: Safety: Goal: Ability to remain free from injury will improve Outcome: Progressing   Problem: Skin Integrity: Goal: Risk for impaired skin integrity will decrease Outcome: Progressing

## 2024-01-04 NOTE — TOC CM/SW Note (Signed)
 Transition of Care [facility]) - Inpatient Brief Assessment   Patient Details  Name: Tyler Mccoy MRN: [id number] Date of Birth: February 05, 1961  [facility] CM/SW Contact:    Juliane Och, LCSW Phone Number: 01/04/2024, 12:06 PM   Clinical Narrative:  12:06 PM Per chart review, patient resides at home with spouse. Patient has a PCP and insurance. Patient does not have HH/SNF/DME history. No TOC needs were identified at this time. TOC will continue to follow and be available to assist.   Transition of Care Asessment: Insurance and Status: Insurance coverage has been reviewed Patient has primary care physician: Yes Home environment has been reviewed: Private Residence Prior level of function:: N/A Prior/Current Home Services: No current home services Social Drivers of Health Review: SDOH reviewed no interventions necessary Readmission risk has been reviewed: Yes Transition of care needs: no transition of care needs at this time

## 2024-01-04 NOTE — Procedures (Signed)
 Pre procedural Dx: U/S guided core needle biopsy kidney  Post procedural Dx: Same  Technically successful U/S guided biopsy of left kidney   EBL: None.   Complications: None immediate.   Zettie Hillock, MD Pager #: [phone]

## 2024-01-05 DIAGNOSIS — N179 Acute kidney failure, unspecified: Secondary | ICD-10-CM | POA: Diagnosis not present

## 2024-01-05 LAB — CBC WITH DIFFERENTIAL/PLATELET
Abs Immature Granulocytes: 0.17 10*3/uL — ABNORMAL HIGH (ref 0.00–0.07)
Abs Immature Granulocytes: 0.12 10*3/uL — ABNORMAL HIGH (ref 0.00–0.07)
Abs Immature Granulocytes: 0.1 10*3/uL — ABNORMAL HIGH (ref 0.00–0.07)
Basophils Absolute: 0.1 10*3/uL (ref 0.0–0.1)
Basophils Absolute: 0.1 10*3/uL (ref 0.0–0.1)
Basophils Absolute: 0 10*3/uL (ref 0.0–0.1)
Basophils Relative: 1 %
Basophils Relative: 1 %
Basophils Relative: 1 %
Eosinophils Absolute: 0.9 10*3/uL — ABNORMAL HIGH (ref 0.0–0.5)
Eosinophils Absolute: 0.8 10*3/uL — ABNORMAL HIGH (ref 0.0–0.5)
Eosinophils Absolute: 0.8 10*3/uL — ABNORMAL HIGH (ref 0.0–0.5)
Eosinophils Relative: 10 %
Eosinophils Relative: 9 %
Eosinophils Relative: 10 %
HCT: 20.3 % — ABNORMAL LOW (ref 39.0–52.0)
HCT: 23.6 % — ABNORMAL LOW (ref 39.0–52.0)
HCT: 20.9 % — ABNORMAL LOW (ref 39.0–52.0)
Hemoglobin: 6.8 g/dL — CL (ref 13.0–17.0)
Hemoglobin: 8 g/dL — ABNORMAL LOW (ref 13.0–17.0)
Hemoglobin: 7.2 g/dL — ABNORMAL LOW (ref 13.0–17.0)
Immature Granulocytes: 2 %
Immature Granulocytes: 1 %
Immature Granulocytes: 1 %
Lymphocytes Relative: 16 %
Lymphocytes Relative: 15 %
Lymphocytes Relative: 13 %
Lymphs Abs: 1.4 10*3/uL (ref 0.7–4.0)
Lymphs Abs: 1.2 10*3/uL (ref 0.7–4.0)
Lymphs Abs: 1.1 10*3/uL (ref 0.7–4.0)
MCH: 31.9 pg (ref 26.0–34.0)
MCH: 32.5 pg (ref 26.0–34.0)
MCH: 32.6 pg (ref 26.0–34.0)
MCHC: 33.5 g/dL (ref 30.0–36.0)
MCHC: 33.9 g/dL (ref 30.0–36.0)
MCHC: 34.4 g/dL (ref 30.0–36.0)
MCV: 95.3 fL (ref 80.0–100.0)
MCV: 95.9 fL (ref 80.0–100.0)
MCV: 94.6 fL (ref 80.0–100.0)
Monocytes Absolute: 1 10*3/uL (ref 0.1–1.0)
Monocytes Absolute: 0.8 10*3/uL (ref 0.1–1.0)
Monocytes Absolute: 1 10*3/uL (ref 0.1–1.0)
Monocytes Relative: 11 %
Monocytes Relative: 10 %
Monocytes Relative: 13 %
Neutro Abs: 5.4 10*3/uL (ref 1.7–7.7)
Neutro Abs: 5.3 10*3/uL (ref 1.7–7.7)
Neutro Abs: 5 10*3/uL (ref 1.7–7.7)
Neutrophils Relative %: 60 %
Neutrophils Relative %: 64 %
Neutrophils Relative %: 62 %
Platelets: 285 10*3/uL (ref 150–400)
Platelets: 351 10*3/uL (ref 150–400)
Platelets: 322 10*3/uL (ref 150–400)
RBC: 2.13 MIL/uL — ABNORMAL LOW (ref 4.22–5.81)
RBC: 2.46 MIL/uL — ABNORMAL LOW (ref 4.22–5.81)
RBC: 2.21 MIL/uL — ABNORMAL LOW (ref 4.22–5.81)
RDW: 12.2 % (ref 11.5–15.5)
RDW: 12.4 % (ref 11.5–15.5)
RDW: 12.3 % (ref 11.5–15.5)
WBC: 8.9 10*3/uL (ref 4.0–10.5)
WBC: 8.3 10*3/uL (ref 4.0–10.5)
WBC: 8 10*3/uL (ref 4.0–10.5)
nRBC: 0 % (ref 0.0–0.2)
nRBC: 0 % (ref 0.0–0.2)
nRBC: 0 % (ref 0.0–0.2)

## 2024-01-05 LAB — TECHNOLOGIST SMEAR REVIEW: Plt Morphology: NORMAL

## 2024-01-05 LAB — VITAMIN B12: Vitamin B-12: 4773 pg/mL — ABNORMAL HIGH (ref 180–914)

## 2024-01-05 LAB — COMPREHENSIVE METABOLIC PANEL WITH GFR
ALT: 27 U/L (ref 0–44)
AST: 14 U/L — ABNORMAL LOW (ref 15–41)
Albumin: 2.9 g/dL — ABNORMAL LOW (ref 3.5–5.0)
Alkaline Phosphatase: 54 U/L (ref 38–126)
Anion gap: 12 (ref 5–15)
BUN: 121 mg/dL — ABNORMAL HIGH (ref 8–23)
CO2: 19 mmol/L — ABNORMAL LOW (ref 22–32)
Calcium: 7.4 mg/dL — ABNORMAL LOW (ref 8.9–10.3)
Chloride: 100 mmol/L (ref 98–111)
Creatinine, Ser: 13.08 mg/dL — ABNORMAL HIGH (ref 0.61–1.24)
GFR, Estimated: 4 mL/min — ABNORMAL LOW (ref >60)
Glucose, Bld: 109 mg/dL — ABNORMAL HIGH (ref 70–99)
Potassium: 4.6 mmol/L (ref 3.5–5.1)
Sodium: 131 mmol/L — ABNORMAL LOW (ref 135–145)
Total Bilirubin: 0.7 mg/dL (ref 0.0–1.2)
Total Protein: 5.5 g/dL — ABNORMAL LOW (ref 6.5–8.1)

## 2024-01-05 LAB — FOLATE: Folate: >40 ng/mL (ref >5.9)

## 2024-01-05 LAB — C4 COMPLEMENT: Complement C4, Body Fluid: 27 mg/dL (ref 12–38)

## 2024-01-05 LAB — C3 COMPLEMENT: C3 Complement: 129 mg/dL (ref 82–167)

## 2024-01-05 LAB — FIBRINOGEN: Fibrinogen: 528 mg/dL — ABNORMAL HIGH (ref 210–475)

## 2024-01-05 LAB — ABO/RH: ABO/RH(D): O POS

## 2024-01-05 LAB — LACTATE DEHYDROGENASE: LDH: 291 U/L — ABNORMAL HIGH (ref 98–192)

## 2024-01-05 LAB — BILIRUBIN, FRACTIONATED(TOT/DIR/INDIR)
Bilirubin, Direct: <0.1 mg/dL (ref 0.0–0.2)
Total Bilirubin: 0.6 mg/dL (ref 0.0–1.2)

## 2024-01-05 LAB — PREPARE RBC (CROSSMATCH)

## 2024-01-05 LAB — MAGNESIUM: Magnesium: 2.1 mg/dL (ref 1.7–2.4)

## 2024-01-05 MED ORDER — SODIUM CHLORIDE 0.9% IV SOLUTION: Freq: Once | INTRAVENOUS | Status: DC

## 2024-01-05 MED ORDER — IRON SUCROSE 200 MG IVPB - SIMPLE MED
200.0000 mg | Freq: Every day | Status: DC
Start: 2024-01-05 — End: 2024-01-10
  Administered 2024-01-05 – 2024-01-06 (×2): 200 mg via INTRAVENOUS
  Filled 2024-01-05: qty 200
  Filled 2024-01-05: qty 110
  Filled 2024-01-05: qty 200

## 2024-01-05 MED ORDER — DARBEPOETIN ALFA 60 MCG/0.3ML IJ SOSY
60.0000 ug | PREFILLED_SYRINGE | INTRAMUSCULAR | Status: DC
  Administered 2024-01-05: 60 ug via SUBCUTANEOUS
  Filled 2024-01-05: qty 0.3

## 2024-01-05 NOTE — Progress Notes (Signed)
   01/05/24 0430  Provider Notification  Provider Name/Title Smith,R,MD  Date Provider Notified 01/05/24  Time Provider Notified 0430  Method of Notification Page  Notification Reason Critical Result (Hemoglobin 6.8)  Provider response See new orders  Date of Provider Response 01/05/24  Time of Provider Response [phone]

## 2024-01-05 NOTE — Progress Notes (Signed)
 Called lab because type and screen had not been completed yet.

## 2024-01-05 NOTE — Plan of Care (Signed)
 Message that patient's hemoglobin dropped down to 6.8.  Ordered 1 unit of packed red blood cells to be transfused.  Goal hemoglobin greater than 7

## 2024-01-05 NOTE — Progress Notes (Signed)
 [facility] Progress Note Patient: Ranger Petrich VWU:[id number] DOB: 1960/10/14 DOA: 01/02/2024  DOS: the patient was seen and examined on 01/05/2024  Brief Hospital Course: Patient with PMH of asthma as well as allergic rhinitis presents to the hospital with complaints of shortness of breath.  Also had some chest pain. Patient is a runner.  Was training for a race.  Parents few miles a day. Reports that on Thursday 5/22 he ran extensively.  Overnight he had some blood in the urine.  He felt tired on the Friday.  Started having pain in his back which he felt was likely due to him passing a kidney stone.  Went to see PCP.  Blood work was done which was unremarkable including lipid panel.  Serum creatinine back down was 1.26.  Was recommended to hydrate. Started with shortness of breath and went to see PCP again on 5/28 and had some chest pain and was recommended to go to hospital for concern for PE. Found to have severe AKI/ARF.  Nephrology consulted.  Underwent renal biopsy.  Assessment and Plan: Acute renal failure. On presentation serum creatinine 12.30 and BUN 147. Renal function on 5/22 serum creatinine 1.06. Took a dose of NSAID, had some blood in the urine on 5/22.  Actively training for a race for last few months. Initiated on IV fluid without any significant improvement in serum creatinine or BUN.  Currently IV fluid is stopped.  Allowing oral hydration. Currently no indication for HD. Nephrology consulted Underwent renal biopsy. ANA positive.  RNP positive. Monitor.  Acute anemia. Hemoglobin on admission was 9.4. Hemoglobin dropping likely combination of dilution.  There is concern for hemolysis given presence of warm antibodies as well as concern for marrow suppression in the setting of renal failure. Receiving iron receiving EPO. Blood transfusion ordered although due to presence of antibody difficult to find a manage as of right now. Patient is minimally symptomatic for  now. Continue to monitor H&H. Transfuse for hemoglobin less than 7 or hemodynamic instability.  Mild hyperkalemia. Treated with IV fluid and Lokelma . Monitor.  Elevated troponin. Likely secondary to poor clearance. No evidence of demand ischemia. Monitor.  Left flank pain. Currently resolved. CT scan unremarkable. Monitor.  Acute asthma flareup. Bilateral expiratory wheezing on examination. Breathing improved with change in nebulizer therapy. Currently holding off on steroids.  Incidental pulmonary nodule. Repeat outpatient per PCP.    Subjective: No nausea no vomiting no fever no chills.  No bleeding.  Pain well-controlled.  Physical Exam: General: in Mild distress, No Rash Cardiovascular: S1 and S2 Present, No Murmur Respiratory: Good respiratory effort, Bilateral Air entry present. No Crackles, No wheezes Abdomen: Bowel Sound present, No tenderness Extremities: No edema Neuro: Alert and oriented x3, no new focal deficit  Data Reviewed: I have Reviewed nursing notes, Vitals, and Lab results. Since last encounter, pertinent lab results CBC and BMP   . I have ordered test including CBC and BMP  . I have discussed pt's care plan and test results with nephrology  .   Disposition: Status is: Inpatient Remains inpatient appropriate because: Monitor for stability of H&H and improvement in renal function.  SCDs Start: 01/03/24 0015   Family Communication: No one at bedside Level of care: Telemetry Medical   Vitals:   01/05/24 0718 01/05/24 0759 01/05/24 0815 01/05/24 1730  BP:  (!) 146/68  (!) 164/78  Pulse:  73  (!) 55  Resp:  19  19  Temp:  (!) 97.5 F (36.4 C)  98.6  F (37 C)  TempSrc:  Oral    SpO2:  99% 97% 100%  Weight: 63 kg     Height:         Author: Charlean Congress, MD 01/05/2024 7:49 PM  Please look on [email or web address] to find out who is on call.

## 2024-01-05 NOTE — Plan of Care (Signed)
   Problem: Education: Goal: Knowledge of General Education information will improve Description: Including pain rating scale, medication(s)/side effects and non-pharmacologic comfort measures Outcome: Completed/Met

## 2024-01-05 NOTE — Plan of Care (Signed)

## 2024-01-05 NOTE — Progress Notes (Signed)
 Notified by the blood bank via secure chat that patient has a new antibody so blood will be delayed.

## 2024-01-05 NOTE — Progress Notes (Signed)
 [facility] Progress Note   Assessment/ Plan:   Assessment/Recommendations: Tyler Mccoy is a/an 63 y.o. male with a past medical history silent reflux with reactive airway disease who present w/ pulmonary wheezing and acute renal failure   Acute Renal Failure: Extensive AKI with normal creatinine about a week ago.  First concern would be dehydration in the setting of recent GI illness as well as NSAID use ultimately leading to tubular injury.  However his history has several clues that are concerning.  Pulmonary disease is somewhat strange and could represent vasculitis.  Also elevated proBNP as well as anemia are concerning and not fully explained.   Despite having a fairly bland urine I think he deserves evaluation for glomerular disease while we hydrate and provide supportive care -Continue with IVFs for now -GN serologies ordered; f/u as they return--> ANA +, anti-RNP + at 1.2 - ordered biopsy- performed 01/04/24 - no indication for HD as of yet but if no improvement we will need to consider- Dr Cindra Cree has discussed this with him too    Wheezing: Chronic issue related to what is felt to be silent reflux.  Better today- is    Anemia: Unclear cause.  No blood loss per patient.  Has antibodies- hemolysis workup in process- ? If this is related to severe AKI-- ?? Wonder if TMA will be more likely on biopsy now--> plts are OK.     Elevated proBNP: fairly elevated for an otherwise healthy gentleman. Obtain Echo especially given his lung issues.   Metabolic acidosis: Continue to monitor with hydration   Hyperkalemia: Mild.  Continue to monitor closely   ALT elevation/bilirubin elevation: Mild.  Will continue to monitor  Subjective:    Hgb 6.8--> 8.0 without intervention- but has a warm autoantibody.  S/p IV iron,  Will order aranesp too   Objective:   BP (!) 146/68 (BP Location: Left Arm)   Pulse 73   Temp (!) 97.5 F (36.4 C) (Oral)   Resp 19   Ht 5\' 5"  (1.651 m)    Wt 63.2 kg   SpO2 97%   BMI 23.19 kg/m   Intake/Output Summary (Last 24 hours) at 01/05/2024 1404 Last data filed at 01/05/2024 1300 Gross per 24 hour  Intake 1800 ml  Output 2670 ml  Net -870 ml   Weight change:   Physical Exam: Gen:NAD CVS: EOMI PERRL Resp: clear Abd: soft Ext: no LE edema  Imaging: US  BIOPSY (KIDNEY) Result Date: 01/04/2024 INDICATION: Acute kidney injury EXAM: Ultrasound-guided renal biopsy MEDICATIONS: None. ANESTHESIA/SEDATION: Moderate (conscious) sedation was employed during this procedure. A total of Versed  2 mg and Fentanyl  50 mcg was administered intravenously by the radiology nurse. Total intra-service moderate Sedation Time: 18 minutes. The patient's level of consciousness and vital signs were monitored continuously by radiology nursing throughout the procedure under my direct supervision. COMPLICATIONS: None immediate. PROCEDURE: Informed written consent was obtained from the patient after a thorough discussion of the procedural risks, benefits and alternatives. All questions were addressed. Maximal Sterile Barrier Technique was utilized including caps, mask, sterile gowns, sterile gloves, sterile drape, hand hygiene and skin antiseptic. A timeout was performed prior to the initiation of the procedure. With the patient in a prone position, both like regions for scanned with ultrasound in order to evaluate multiple target region within the best visualized kidney. The left kidney was chosen as the best visualized. The left flank was prepped and draped in usual sterile fashion. Local anesthesia was achieved with 1% lidocaine .  Lidocaine  was administered by infiltrating the skin to the level of the renal capsule of under ultrasound guidance by bands in the needle and injected lidocaine . A small incision was made patient's flight and an introducer needle was advanced to the lower pole left kidney. Once in position of the needle was removed leaving the cannula behind  for access to the left kidney. The bio Pince biopsy needle was then advanced under ultrasound guidance through the cannula and into the lower pole. This was performed twice and 218 gauge cores were obtained of the renal parenchyma. Gel-Foam slurry was injected through the cannula the cannula was removed the patient. Sterile dressing was applied. Final image demonstrates no active hemorrhage. IMPRESSION: Successful core needle biopsy of the lower pole left kidney. Electronically Signed   By: Susan Ensign   On: 01/04/2024 12:47    Labs: BMET Recent Labs  Lab 01/02/24 1447 01/02/24 1731 01/03/24 0610 01/04/24 0549 01/05/24 0317  NA 135 135 136 134* 131*  K 5.6* 5.3* 4.8 4.4 4.6  CL 98  --  107 104 100  CO2 15*  --  18* 18* 19*  GLUCOSE 114*  --  112* 108* 109*  BUN 147*  --  145* 135* 121*  CREATININE 12.30*  --  12.80* 12.99* 13.08*  CALCIUM 9.6  --  8.8* 8.0* 7.4*  PHOS  --   --   --  7.6*  --    CBC Recent Labs  Lab 01/02/24 1447 01/02/24 1731 01/04/24 0549 01/05/24 0317 01/05/24 1241  WBC 10.8*  --  8.6 8.9 8.3  NEUTROABS  --   --  4.7 5.4 5.3  HGB 9.4* 8.5* 7.3* 6.8* 8.0*  HCT 28.1* 25.0* 21.9* 20.3* 23.6*  MCV 97.6  --  96.9 95.3 95.9  PLT 312  --  299 285 351    Medications:     sodium chloride    Intravenous Once   arformoterol   15 mcg Nebulization BID   benzonatate   100 mg Oral TID   budesonide  (PULMICORT ) nebulizer solution  0.25 mg Nebulization BID   fluticasone  furoate-vilanterol  1 puff Inhalation Daily   guaiFENesin   600 mg Oral BID   melatonin  3 mg Oral QHS    Leandra Pro MD 01/05/2024, 2:04 PM

## 2024-01-06 DIAGNOSIS — N179 Acute kidney failure, unspecified: Secondary | ICD-10-CM | POA: Diagnosis not present

## 2024-01-06 LAB — MAGNESIUM: Magnesium: 2.1 mg/dL (ref 1.7–2.4)

## 2024-01-06 LAB — CBC WITH DIFFERENTIAL/PLATELET
Abs Immature Granulocytes: 0.07 10*3/uL (ref 0.00–0.07)
Abs Immature Granulocytes: 0.07 10*3/uL (ref 0.00–0.07)
Basophils Absolute: 0 10*3/uL (ref 0.0–0.1)
Basophils Absolute: 0.1 10*3/uL (ref 0.0–0.1)
Basophils Relative: 1 %
Basophils Relative: 1 %
Eosinophils Absolute: 0.8 10*3/uL — ABNORMAL HIGH (ref 0.0–0.5)
Eosinophils Absolute: 0.6 10*3/uL — ABNORMAL HIGH (ref 0.0–0.5)
Eosinophils Relative: 11 %
Eosinophils Relative: 8 %
HCT: 22.8 % — ABNORMAL LOW (ref 39.0–52.0)
HCT: 22.9 % — ABNORMAL LOW (ref 39.0–52.0)
Hemoglobin: 7.6 g/dL — ABNORMAL LOW (ref 13.0–17.0)
Hemoglobin: 7.7 g/dL — ABNORMAL LOW (ref 13.0–17.0)
Immature Granulocytes: 1 %
Immature Granulocytes: 1 %
Lymphocytes Relative: 16 %
Lymphocytes Relative: 14 %
Lymphs Abs: 1.2 10*3/uL (ref 0.7–4.0)
Lymphs Abs: 1.1 10*3/uL (ref 0.7–4.0)
MCH: 32.2 pg (ref 26.0–34.0)
MCH: 32.6 pg (ref 26.0–34.0)
MCHC: 33.3 g/dL (ref 30.0–36.0)
MCHC: 33.6 g/dL (ref 30.0–36.0)
MCV: 96.6 fL (ref 80.0–100.0)
MCV: 97 fL (ref 80.0–100.0)
Monocytes Absolute: 0.9 10*3/uL (ref 0.1–1.0)
Monocytes Absolute: 0.9 10*3/uL (ref 0.1–1.0)
Monocytes Relative: 12 %
Monocytes Relative: 12 %
Neutro Abs: 4.4 10*3/uL (ref 1.7–7.7)
Neutro Abs: 4.9 10*3/uL (ref 1.7–7.7)
Neutrophils Relative %: 59 %
Neutrophils Relative %: 64 %
Platelets: 361 10*3/uL (ref 150–400)
Platelets: 373 10*3/uL (ref 150–400)
RBC: 2.36 MIL/uL — ABNORMAL LOW (ref 4.22–5.81)
RBC: 2.36 MIL/uL — ABNORMAL LOW (ref 4.22–5.81)
RDW: 12.7 % (ref 11.5–15.5)
RDW: 12.7 % (ref 11.5–15.5)
WBC: 7.3 10*3/uL (ref 4.0–10.5)
WBC: 7.7 10*3/uL (ref 4.0–10.5)
nRBC: 0 % (ref 0.0–0.2)
nRBC: 0 % (ref 0.0–0.2)

## 2024-01-06 LAB — COMPREHENSIVE METABOLIC PANEL WITH GFR
ALT: 21 U/L (ref 0–44)
AST: 15 U/L (ref 15–41)
Albumin: 3.3 g/dL — ABNORMAL LOW (ref 3.5–5.0)
Alkaline Phosphatase: 63 U/L (ref 38–126)
Anion gap: 16 — ABNORMAL HIGH (ref 5–15)
BUN: 118 mg/dL — ABNORMAL HIGH (ref 8–23)
CO2: 17 mmol/L — ABNORMAL LOW (ref 22–32)
Calcium: 7.9 mg/dL — ABNORMAL LOW (ref 8.9–10.3)
Chloride: 101 mmol/L (ref 98–111)
Creatinine, Ser: 13.37 mg/dL — ABNORMAL HIGH (ref 0.61–1.24)
GFR, Estimated: 4 mL/min — ABNORMAL LOW (ref >60)
Glucose, Bld: 102 mg/dL — ABNORMAL HIGH (ref 70–99)
Potassium: 4.5 mmol/L (ref 3.5–5.1)
Sodium: 134 mmol/L — ABNORMAL LOW (ref 135–145)
Total Bilirubin: 0.8 mg/dL (ref 0.0–1.2)
Total Protein: 6.3 g/dL — ABNORMAL LOW (ref 6.5–8.1)

## 2024-01-06 LAB — HAPTOGLOBIN: Haptoglobin: 164 mg/dL (ref 32–363)

## 2024-01-06 MED ORDER — SODIUM CHLORIDE 0.9 % IV SOLN
200.0000 mg | Freq: Every day | INTRAVENOUS | Status: DC
  Administered 2024-01-07 – 2024-01-08 (×2): 200 mg via INTRAVENOUS
  Filled 2024-01-06 (×3): qty 10

## 2024-01-06 MED ORDER — SODIUM CHLORIDE 0.45 % IV SOLN
INTRAVENOUS | Status: DC
  Filled 2024-01-06 (×2): qty 75

## 2024-01-06 NOTE — Progress Notes (Addendum)
 [facility] Progress Note Patient: Tyler Mccoy ZOX:[id number] DOB: 03/29/1961 DOA: 01/02/2024  DOS: the patient was seen and examined on 01/06/2024  Brief Hospital Course: Patient with PMH of asthma as well as allergic rhinitis presents to the hospital with complaints of shortness of breath.  Also had some chest pain. Patient is a runner.  Was training for a race.  Parents few miles a day. Reports that on Thursday 5/22 he ran extensively.  Overnight he had some blood in the urine.  He felt tired on the Friday.  Started having pain in his back which he felt was likely due to him passing a kidney stone.  Went to see PCP.  Blood work was done which was unremarkable including lipid panel.  Serum creatinine back down was 1.26.  Was recommended to hydrate. Started with shortness of breath and went to see PCP again on 5/28 and had some chest pain and was recommended to go to hospital for concern for PE. Found to have severe AKI/ARF.  Nephrology consulted.  Underwent renal biopsy.  Assessment and Plan: Acute renal failure. On presentation serum creatinine 12.30 and BUN 147. Renal function on 5/22 serum creatinine 1.06. Took a dose of NSAID, had some blood in the urine on 5/22.  Actively training for a race for last few months. Initiated on IV fluid without any significant improvement in serum creatinine or BUN.  IV fluid was briefly stopped.  Resumed on 5 6/1.   Currently no indication for HD. Nephrology consulted Underwent renal biopsy.  Results pending. ANA positive.  RNP positive. Monitor.  Acute anemia. Hemoglobin on admission was 9.4. Hemoglobin dropping likely combination of dilution.  There is concern for hemolysis given presence of warm antibodies as well as concern for marrow suppression in the setting of renal failure. Receiving iron receiving EPO. Blood transfusion ordered although due to presence of antibody difficult to find a manage as of right now. Patient is minimally  symptomatic for now. Continue to monitor H&H. Transfuse for hemoglobin less than 7 or hemodynamic instability. Will discuss with hematology tomorrow.  Mild hyperkalemia. Treated with IV fluid and Lokelma . Monitor.  Elevated troponin. Likely secondary to poor clearance. No evidence of demand ischemia. Monitor.  Left flank pain. Currently resolved. CT scan unremarkable. Monitor.  Acute asthma flareup. Bilateral expiratory wheezing on examination. Breathing improved with change in nebulizer therapy. Currently holding off on steroids.  Incidental pulmonary nodule. Repeat outpatient per PCP.    Addendum. Notified by the RN that the patient received blood from [place].  It does still have antibodies.  Given that the patient is hemoglobin is stable would minimize unnecessary transfusion discontinue transfusion. .  Subjective: No acute complaint.  Breathing okay.  Missed his nebulizer for some reason yesterday.  Physical Exam: General: in Mild distress, No Rash Cardiovascular: S1 and S2 Present, No Murmur Respiratory: Good respiratory effort, Bilateral Air entry present. No Crackles, bilateral expiratory wheezes Abdomen: Bowel Sound present, No tenderness Extremities: No edema Neuro: Alert and oriented x3, no new focal deficit  Data Reviewed: I have Reviewed nursing notes, Vitals, and Lab results. Since last encounter, pertinent lab results CBC and BMP   . I have ordered test including CBC and BMP  .   Disposition: Status is: Inpatient Remains inpatient appropriate because: Monitor for improvement in renal function  SCDs Start: 01/03/24 0015   Family Communication: No one at bedside Level of care: Telemetry Medical   Vitals:   01/06/24 8119 01/06/24 1478 01/06/24 2956 01/06/24 1649  BP: (!) 140/78 (!) 153/81  (!) 156/83  Pulse: 75 69 72 65  Resp: 18 19  18   Temp: 98.7 F (37.1 C) 98.3 F (36.8 C)  98 F (36.7 C)  TempSrc:  Oral  Oral  SpO2: 95% 98% 98% 99%   Weight:      Height:         Author: Charlean Congress, MD 01/06/2024 5:58 PM  Please look on [email or web address] to find out who is on call.

## 2024-01-06 NOTE — Progress Notes (Signed)
 [facility] Progress Note   Assessment/ Plan:   Assessment/Recommendations: Tyler Mccoy is a/an 63 y.o. male with a past medical history silent reflux with reactive airway disease who present w/ pulmonary wheezing and acute renal failure   Acute Renal Failure: Extensive AKI with normal creatinine about a week ago.  First concern would be dehydration in the setting of recent GI illness as well as NSAID use ultimately leading to tubular injury.  However his history has several clues that are concerning.  Pulmonary disease is somewhat strange and could represent vasculitis.  Also elevated proBNP as well as anemia are concerning and not fully explained.   Despite having a fairly bland urine I think he deserves evaluation for glomerular disease while we hydrate and provide supportive care -Continue with IVFs for now -GN serologies ordered; f/u as they return--> ANA +, anti-RNP + at 1.2 - ordered biopsy- performed 01/04/24 - no indication for HD as of yet but if no improvement we will need to consider- Dr Cindra Cree has discussed this with him too - expecting biopsy results tomorrow--> hoping to avoid dialysis but his numbers aren't improving as hoped-expect biopsy to provide us  better direction about AKI    Wheezing: Chronic issue related to what is felt to be silent reflux.  Better today- is    Anemia: Unclear cause.  No blood loss per patient.  Has antibodies- hemolysis workup in process but doesn't seem to be overwhelmingly positive. ? If this is related to severe AKI-- ?? Wonder if TMA will be more likely on biopsy now--> plts are OK.     Elevated proBNP: fairly elevated for an otherwise healthy gentleman. Obtain Echo especially given his lung issues.   Metabolic acidosis: Continue to monitor with hydration   Hyperkalemia: Mild.  Continue to monitor closely   ALT elevation/bilirubin elevation: Mild.  Will continue to monitor  Subjective:    Hgb holding above transfusion  threshold.  Feeling OK.  BUN down, Cr up at 13.66   Objective:   BP (!) 153/81 (BP Location: Right Arm)   Pulse 72   Temp 98.3 F (36.8 C) (Oral)   Resp 19   Ht 5\' 5"  (1.651 m)   Wt 63 kg   SpO2 98%   BMI 23.11 kg/m   Intake/Output Summary (Last 24 hours) at 01/06/2024 0950 Last data filed at 01/06/2024 1610 Gross per 24 hour  Intake 1127.4 ml  Output 3000 ml  Net -1872.6 ml   Weight change:   Physical Exam: Gen:NAD CVS: EOMI PERRL Resp: clear Abd: soft Ext: no LE edema  Imaging: US  BIOPSY (KIDNEY) Result Date: 01/04/2024 INDICATION: Acute kidney injury EXAM: Ultrasound-guided renal biopsy MEDICATIONS: None. ANESTHESIA/SEDATION: Moderate (conscious) sedation was employed during this procedure. A total of Versed  2 mg and Fentanyl  50 mcg was administered intravenously by the radiology nurse. Total intra-service moderate Sedation Time: 18 minutes. The patient's level of consciousness and vital signs were monitored continuously by radiology nursing throughout the procedure under my direct supervision. COMPLICATIONS: None immediate. PROCEDURE: Informed written consent was obtained from the patient after a thorough discussion of the procedural risks, benefits and alternatives. All questions were addressed. Maximal Sterile Barrier Technique was utilized including caps, mask, sterile gowns, sterile gloves, sterile drape, hand hygiene and skin antiseptic. A timeout was performed prior to the initiation of the procedure. With the patient in a prone position, both like regions for scanned with ultrasound in order to evaluate multiple target region within the best visualized kidney. The  left kidney was chosen as the best visualized. The left flank was prepped and draped in usual sterile fashion. Local anesthesia was achieved with 1% lidocaine . Lidocaine  was administered by infiltrating the skin to the level of the renal capsule of under ultrasound guidance by bands in the needle and injected  lidocaine . A small incision was made patient's flight and an introducer needle was advanced to the lower pole left kidney. Once in position of the needle was removed leaving the cannula behind for access to the left kidney. The bio Pince biopsy needle was then advanced under ultrasound guidance through the cannula and into the lower pole. This was performed twice and 218 gauge cores were obtained of the renal parenchyma. Gel-Foam slurry was injected through the cannula the cannula was removed the patient. Sterile dressing was applied. Final image demonstrates no active hemorrhage. IMPRESSION: Successful core needle biopsy of the lower pole left kidney. Electronically Signed   By: Susan Ensign   On: 01/04/2024 12:47    Labs: BMET Recent Labs  Lab 01/02/24 1447 01/02/24 1731 01/03/24 0610 01/04/24 0549 01/05/24 0317 01/06/24 0733  NA 135 135 136 134* 131* 134*  K 5.6* 5.3* 4.8 4.4 4.6 4.5  CL 98  --  107 104 100 101  CO2 15*  --  18* 18* 19* 17*  GLUCOSE 114*  --  112* 108* 109* 102*  BUN 147*  --  145* 135* 121* 118*  CREATININE 12.30*  --  12.80* 12.99* 13.08* 13.37*  CALCIUM 9.6  --  8.8* 8.0* 7.4* 7.9*  PHOS  --   --   --  7.6*  --   --    CBC Recent Labs  Lab 01/05/24 0317 01/05/24 1241 01/05/24 2148 01/06/24 0733  WBC 8.9 8.3 8.0 7.3  NEUTROABS 5.4 5.3 5.0 4.4  HGB 6.8* 8.0* 7.2* 7.6*  HCT 20.3* 23.6* 20.9* 22.8*  MCV 95.3 95.9 94.6 96.6  PLT 285 351 322 361    Medications:     sodium chloride    Intravenous Once   arformoterol   15 mcg Nebulization BID   benzonatate   100 mg Oral TID   budesonide  (PULMICORT ) nebulizer solution  0.25 mg Nebulization BID   darbepoetin (ARANESP) injection - NON-DIALYSIS  60 mcg Subcutaneous Q Sat-1800   fluticasone  furoate-vilanterol  1 puff Inhalation Daily   guaiFENesin   600 mg Oral BID   melatonin  3 mg Oral QHS    Leandra Pro MD 01/06/2024, 9:50 AM

## 2024-01-06 NOTE — Plan of Care (Signed)
   Problem: Health Behavior/Discharge Planning: Goal: Ability to manage health-related needs will improve Outcome: Progressing

## 2024-01-06 NOTE — Plan of Care (Signed)
   Problem: Health Behavior/Discharge Planning: Goal: Ability to manage health-related needs will improve Outcome: Progressing   Problem: Clinical Measurements: Goal: Ability to maintain clinical measurements within normal limits will improve Outcome: Progressing

## 2024-01-07 ENCOUNTER — Inpatient Hospital Stay (HOSPITAL_COMMUNITY)

## 2024-01-07 DIAGNOSIS — N179 Acute kidney failure, unspecified: Secondary | ICD-10-CM | POA: Diagnosis not present

## 2024-01-07 LAB — MAGNESIUM: Magnesium: 2.1 mg/dL (ref 1.7–2.4)

## 2024-01-07 LAB — RENAL FUNCTION PANEL
Albumin: 2.9 g/dL — ABNORMAL LOW (ref 3.5–5.0)
Anion gap: 12 (ref 5–15)
BUN: 110 mg/dL — ABNORMAL HIGH (ref 8–23)
CO2: 20 mmol/L — ABNORMAL LOW (ref 22–32)
Calcium: 7.7 mg/dL — ABNORMAL LOW (ref 8.9–10.3)
Chloride: 104 mmol/L (ref 98–111)
Creatinine, Ser: 13.27 mg/dL — ABNORMAL HIGH (ref 0.61–1.24)
GFR, Estimated: 4 mL/min — ABNORMAL LOW (ref >60)
Glucose, Bld: 104 mg/dL — ABNORMAL HIGH (ref 70–99)
Phosphorus: 7.7 mg/dL — ABNORMAL HIGH (ref 2.5–4.6)
Potassium: 4.5 mmol/L (ref 3.5–5.1)
Sodium: 136 mmol/L (ref 135–145)

## 2024-01-07 LAB — IMMUNOFIXATION ELECTROPHORESIS
IgA: 176 mg/dL (ref 61–437)
IgG (Immunoglobin G), Serum: 1029 mg/dL (ref 603–1613)
IgM (Immunoglobulin M), Srm: 52 mg/dL (ref 20–172)
Total Protein ELP: 6.1 g/dL (ref 6.0–8.5)

## 2024-01-07 LAB — ADAMTS13 ACTIVITY REFLEX

## 2024-01-07 LAB — CBC
HCT: 21.5 % — ABNORMAL LOW (ref 39.0–52.0)
Hemoglobin: 7 g/dL — ABNORMAL LOW (ref 13.0–17.0)
MCH: 31.7 pg (ref 26.0–34.0)
MCHC: 32.6 g/dL (ref 30.0–36.0)
MCV: 97.3 fL (ref 80.0–100.0)
Platelets: 430 10*3/uL — ABNORMAL HIGH (ref 150–400)
RBC: 2.21 MIL/uL — ABNORMAL LOW (ref 4.22–5.81)
RDW: 12.6 % (ref 11.5–15.5)
WBC: 7.9 10*3/uL (ref 4.0–10.5)
nRBC: 0 % (ref 0.0–0.2)

## 2024-01-07 LAB — ECHOCARDIOGRAM COMPLETE
AR max vel: 2.12 cm2
AV Peak grad: 14.1 mmHg
Ao pk vel: 1.88 m/s
Area-P 1/2: 3.21 cm2
Height: 65 in
S' Lateral: 2.7 cm
Weight: 2229.29 [oz_av]

## 2024-01-07 LAB — ADAMTS13 ACTIVITY: Adamts 13 Activity: 98 % (ref >66.8)

## 2024-01-07 MED ORDER — B COMPLEX-C PO TABS
1.0000 | ORAL_TABLET | Freq: Every day | ORAL | Status: DC
  Administered 2024-01-07 – 2024-01-08 (×2): 1 via ORAL
  Filled 2024-01-07 (×2): qty 1

## 2024-01-07 MED ORDER — SODIUM BICARBONATE 650 MG PO TABS
1300.0000 mg | ORAL_TABLET | Freq: Three times a day (TID) | ORAL | Status: DC
  Administered 2024-01-07 – 2024-01-08 (×3): 1300 mg via ORAL
  Filled 2024-01-07 (×3): qty 2

## 2024-01-07 MED ORDER — ZOLPIDEM TARTRATE 5 MG PO TABS: 5.0000 mg | ORAL_TABLET | Freq: Every evening | ORAL | Status: DC | PRN

## 2024-01-07 MED ORDER — PREDNISONE 50 MG PO TABS
50.0000 mg | ORAL_TABLET | Freq: Every day | ORAL | Status: DC
  Administered 2024-01-07 – 2024-01-08 (×2): 50 mg via ORAL
  Filled 2024-01-07 (×2): qty 1

## 2024-01-07 NOTE — Progress Notes (Signed)
 [facility] Progress Note Patient: Tyler Mccoy ZOX:[id number] DOB: 05/30/1961 DOA: 01/02/2024  DOS: the patient was seen and examined on 01/07/2024  Brief Hospital Course: Patient with PMH of asthma as well as allergic rhinitis presents to the hospital with complaints of shortness of breath.  Also had some chest pain. Patient is a runner.  Was training for a race.  Parents few miles a day. Reports that on Thursday 5/22 he ran extensively.  Overnight he had some blood in the urine.  He felt tired on the Friday.  Started having pain in his back which he felt was likely due to him passing a kidney stone.  Went to see PCP.  Blood work was done which was unremarkable including lipid panel.  Serum creatinine back down was 1.26.  Was recommended to hydrate. Started with shortness of breath and went to see PCP again on 5/28 and had some chest pain and was recommended to go to hospital for concern for PE. Found to have severe AKI/ARF.  Nephrology consulted.  Underwent renal biopsy. Assessment and Plan: Acute renal failure. On presentation serum creatinine 12.30 and BUN 147. Renal function on 5/22 serum creatinine 1.06. Nephrology consulted Took a dose of NSAID, had some blood in the urine on 5/22.  Actively training for a race for last few months. Initiated on IV fluid without any significant improvement in serum creatinine or BUN. Currently no indication for HD. Underwent renal biopsy.  Results pending. ANA positive.  RNP positive.  Warm antibodies in blood. Monitor.  Acute anemia. Hemoglobin on admission was 9.4. Hemoglobin dropping likely combination of dilution.  There is concern for hemolysis given presence of warm antibodies as well as concern for marrow suppression in the setting of renal failure. Receiving iron receiving EPO. Blood transfusion ordered although due to presence of antibody difficult to find a manage as of right now. Patient is minimally symptomatic for now. Continue to  monitor H&H. Transfuse for hemoglobin less than 7 or hemodynamic instability. Appreciate discussion with hematology.  In an event if the patient requires a blood transfusion, pretreatment with Pepcid, Benadryl Tylenol  and steroid would be recommended for transfusion.  Mild hyperkalemia. Treated with IV fluid and Lokelma . Monitor.  Elevated troponin. Likely secondary to poor clearance. No evidence of demand ischemia. Monitor.  Left flank pain. Currently resolved. CT scan unremarkable. Monitor.  Acute asthma flareup. Bilateral expiratory wheezing on examination. Breathing improved with change in nebulizer therapy. Discussed with nephrology.  Okay to start steroids.  Will initiate at 50 mg oral prednisone.  Chest x-ray unremarkable.  Incidental pulmonary nodule. Repeat outpatient per PCP.    Subjective:   No nausea no vomiting or ongoing cough short of breath.  Physical Exam: General: in Mild distress, No Rash Cardiovascular: S1 and S2 Present, No Murmur Respiratory: Good respiratory effort, Bilateral Air entry present. No Crackles, bilateral expiratory wheezes Abdomen: Bowel Sound present, No tenderness Extremities: No edema Neuro: Alert and oriented x3, no new focal deficit  Data Reviewed: I have Reviewed nursing notes, Vitals, and Lab results. Since last encounter, pertinent lab results CBC and BMP   . I have ordered test including CBC and BMP  .  Discussed with nephrology  Disposition: Status is: Inpatient Remains inpatient appropriate because: Monitor for improvement in renal function  Place TED hose Start: 01/07/24 1300 SCDs Start: 01/03/24 0015   Family Communication: No one at bedside Level of care: Telemetry Medical   Vitals:   01/07/24 8119 01/07/24 0813 01/07/24 1478 01/07/24 1701  BP:  (!) 156/80  (!) 168/81  Pulse:  75  63  Resp:  19  18  Temp:  98.4 F (36.9 C)  98 F (36.7 C)  TempSrc:    Oral  SpO2: 97% 98% 96% 97%  Weight:      Height:          Author: Charlean Congress, MD 01/07/2024 6:44 PM  Please look on [email or web address] to find out who is on call.

## 2024-01-07 NOTE — Progress Notes (Addendum)
 [facility] Progress Note   Assessment/ Plan:   Assessment/Recommendations: Tyler Mccoy is a/an 63 y.o. male with a past medical history silent reflux with reactive airway disease who present w/ pulmonary wheezing and acute renal failure   Acute Renal Failure: Extensive AKI with normal creatinine about a week ago.  First concern would be dehydration in the setting of recent GI illness as well as NSAID use ultimately leading to tubular injury.  However his history has several clues that are concerning.  Pulmonary disease is somewhat strange and could represent vasculitis.  Also elevated proBNP as well as anemia are concerning and not fully explained.   Despite having a fairly bland urine I think he deserves evaluation for glomerular disease while we hydrate and provide supportive care -not hypovolemic and taking good PO Intake - d/c IVF -GN serologies ordered; f/u as they return--> ANA +, anti-RNP + at 1.2, complements normal, ANCA neg. SEPE neg, SFLC normal ratio. - ordered biopsy- performed 01/04/24 - no indication for HD as of yet but if no improvement we will need to consider- Dr Cindra Cree has discussed this with him too - expecting biopsy results today--> hoping to avoid dialysis but his numbers aren't improving as hoped-expect biopsy to provide us  better direction about AKI    Wheezing: Chronic issue related to what is felt to be silent reflux.     Anemia: Unclear cause.  No blood loss per patient but iron sat 11%.   Has antibodies- hemolysis workup in process but doesn't seem to be overwhelmingly positive. ? If this is related to severe AKI-- ?? Wonder if TMA will be more likely on biopsy now--> plts have been normal.  Receiving ESA and iron.    Elevated proBNP: fairly elevated for an otherwise healthy gentleman. Obtain Echo especially given his lung issues - not yet done   Metabolic acidosis: mild, CTM - switch IV bicarb gtt to po supplement today.    Subjective:     Hgb holding above transfusion threshold.   UOP 2.4L yest Taking good PO  intake Ambulated in hall - stopped after 1 lap due to DOE, no orthopnea   Objective:   BP (!) 156/80 (BP Location: Right Arm)   Pulse 75   Temp 98.4 F (36.9 C)   Resp 19   Ht 5\' 5"  (1.651 m)   Wt 63 kg   SpO2 96%   BMI 23.11 kg/m   Intake/Output Summary (Last 24 hours) at 01/07/2024 1155 Last data filed at 01/07/2024 1135 Gross per 24 hour  Intake 2445.12 ml  Output 2150 ml  Net 295.12 ml   Weight change:   Physical Exam: Gen:NAD CVS: EOMI PERRL Resp: clear Abd: soft Ext: no LE edema  Imaging: No results found.   Labs: BMET Recent Labs  Lab 01/02/24 1447 01/02/24 1731 01/03/24 0610 01/04/24 0549 01/05/24 0317 01/06/24 0733 01/07/24 0446  NA 135 135 136 134* 131* 134* 136  K 5.6* 5.3* 4.8 4.4 4.6 4.5 4.5  CL 98  --  107 104 100 101 104  CO2 15*  --  18* 18* 19* 17* 20*  GLUCOSE 114*  --  112* 108* 109* 102* 104*  BUN 147*  --  145* 135* 121* 118* 110*  CREATININE 12.30*  --  12.80* 12.99* 13.08* 13.37* 13.27*  CALCIUM 9.6  --  8.8* 8.0* 7.4* 7.9* 7.7*  PHOS  --   --   --  7.6*  --   --  7.7*  CBC Recent Labs  Lab 01/05/24 1241 01/05/24 2148 01/06/24 0733 01/06/24 1704 01/07/24 0446  WBC 8.3 8.0 7.3 7.7 7.9  NEUTROABS 5.3 5.0 4.4 4.9  --   HGB 8.0* 7.2* 7.6* 7.7* 7.0*  HCT 23.6* 20.9* 22.8* 22.9* 21.5*  MCV 95.9 94.6 96.6 97.0 97.3  PLT 351 322 361 373 430*    Medications:     sodium chloride    Intravenous Once   arformoterol   15 mcg Nebulization BID   benzonatate   100 mg Oral TID   budesonide  (PULMICORT ) nebulizer solution  0.25 mg Nebulization BID   darbepoetin (ARANESP) injection - NON-DIALYSIS  60 mcg Subcutaneous Q Sat-1800   fluticasone  furoate-vilanterol  1 puff Inhalation Daily   guaiFENesin   600 mg Oral BID   melatonin  3 mg Oral QHS

## 2024-01-07 NOTE — TOC Progression Note (Addendum)
 Transition of Care [facility]) - Progression Note    Patient Details  Name: Tyler Mccoy MRN: [id number] Date of Birth: September 26, 1960  [facility]) CM/SW Contact  Tom-Johnson, Angelique Ken, RN Phone Number: 01/07/2024, 3:38 PM  Clinical Narrative:     Patient underwent Renal biopsy on 01/04/24 by IR for elevated Creatinine/AKI, results pending. Nephrology following, no indication for HD at this time but will consider if no improvement. Creatinine at 13.27 today.  Patient not Medically ready for discharge.  CM will continue to follow as patient progresses with care towards discharge.            Expected Discharge Plan and Services                                               Social Determinants of Health (SDOH) Interventions SDOH Screenings   Food Insecurity: No Food Insecurity (01/02/2024)  Housing: Low Risk  (01/02/2024)  Transportation Needs: No Transportation Needs (01/02/2024)  Utilities: Not At Risk (01/02/2024)  Tobacco Use: Low Risk  (01/02/2024)    Readmission Risk Interventions     No data to display

## 2024-01-08 DIAGNOSIS — N179 Acute kidney failure, unspecified: Secondary | ICD-10-CM | POA: Diagnosis not present

## 2024-01-08 LAB — RENAL FUNCTION PANEL
Albumin: 3.1 g/dL — ABNORMAL LOW (ref 3.5–5.0)
Anion gap: 11 (ref 5–15)
BUN: 101 mg/dL — ABNORMAL HIGH (ref 8–23)
CO2: 22 mmol/L (ref 22–32)
Calcium: 7.8 mg/dL — ABNORMAL LOW (ref 8.9–10.3)
Chloride: 104 mmol/L (ref 98–111)
Creatinine, Ser: 13.13 mg/dL — ABNORMAL HIGH (ref 0.61–1.24)
GFR, Estimated: 4 mL/min — ABNORMAL LOW (ref >60)
Glucose, Bld: 145 mg/dL — ABNORMAL HIGH (ref 70–99)
Phosphorus: 7.5 mg/dL — ABNORMAL HIGH (ref 2.5–4.6)
Potassium: 4.8 mmol/L (ref 3.5–5.1)
Sodium: 137 mmol/L (ref 135–145)

## 2024-01-08 LAB — CBC
HCT: 23.8 % — ABNORMAL LOW (ref 39.0–52.0)
Hemoglobin: 8 g/dL — ABNORMAL LOW (ref 13.0–17.0)
MCH: 32.4 pg (ref 26.0–34.0)
MCHC: 33.6 g/dL (ref 30.0–36.0)
MCV: 96.4 fL (ref 80.0–100.0)
Platelets: 471 10*3/uL — ABNORMAL HIGH (ref 150–400)
RBC: 2.47 MIL/uL — ABNORMAL LOW (ref 4.22–5.81)
RDW: 12.4 % (ref 11.5–15.5)
WBC: 10.3 10*3/uL (ref 4.0–10.5)
nRBC: 0 % (ref 0.0–0.2)

## 2024-01-08 LAB — MAGNESIUM: Magnesium: 2 mg/dL (ref 1.7–2.4)

## 2024-01-08 MED ORDER — PREDNISONE 10 MG PO TABS: ORAL_TABLET | ORAL | 0 refills | Status: AC

## 2024-01-08 MED ORDER — SODIUM BICARBONATE 650 MG PO TABS: 650.0000 mg | ORAL_TABLET | Freq: Two times a day (BID) | ORAL | 0 refills | Status: AC

## 2024-01-08 MED ORDER — BENZONATATE 100 MG PO CAPS
100.0000 mg | ORAL_CAPSULE | Freq: Three times a day (TID) | ORAL | 0 refills | Status: AC
Start: 2024-01-08 — End: ?

## 2024-01-08 MED ORDER — MELATONIN 3 MG PO TABS: 3.0000 mg | ORAL_TABLET | Freq: Every day | ORAL | 0 refills | Status: AC

## 2024-01-08 MED ORDER — B COMPLEX-C PO TABS: 1.0000 | ORAL_TABLET | Freq: Every day | ORAL | 0 refills | Status: AC

## 2024-01-08 MED ORDER — POLYETHYLENE GLYCOL 3350 17 G PO PACK: 17.0000 g | PACK | Freq: Every day | ORAL | 0 refills | Status: AC | PRN

## 2024-01-08 MED ORDER — GUAIFENESIN ER 600 MG PO TB12: 600.0000 mg | ORAL_TABLET | Freq: Two times a day (BID) | ORAL | 0 refills | Status: AC

## 2024-01-08 NOTE — TOC Transition Note (Signed)
 Transition of Care [facility]) - Discharge Note   Patient Details  Name: Tyler Mccoy MRN: [id number] Date of Birth: 1960-12-22  Transition of Care [facility]) CM/SW Contact:  Tom-Johnson, Angelique Ken, RN Phone Number: 01/08/2024, 12:32 PM   Clinical Narrative:     Patient is scheduled for discharge today.  Readmission Risk Assessment done. Outpatient f/u, hospital f/u and discharge instructions on AVS. Family to transport at discharge.  No further TOC needs noted.       Final next level of care: Home/Self Care Barriers to Discharge: Barriers Resolved   Patient Goals and CMS Choice Patient states their goals for this hospitalization and ongoing recovery are:: To return home CMS Medicare.gov Compare Post Acute Care list provided to:: Patient Choice offered to / list presented to : NA      Discharge Placement                Patient to be transferred to facility by: Family      Discharge Plan and Services Additional resources added to the After Visit Summary for                  DME Arranged: N/A DME Agency: NA       HH Arranged: NA HH Agency: NA        Social Drivers of Health (SDOH) Interventions SDOH Screenings   Food Insecurity: No Food Insecurity (01/02/2024)  Housing: Low Risk  (01/02/2024)  Transportation Needs: No Transportation Needs (01/02/2024)  Utilities: Not At Risk (01/02/2024)  Tobacco Use: Low Risk  (01/02/2024)     Readmission Risk Interventions     No data to display

## 2024-01-08 NOTE — Discharge Summary (Signed)
 Physician Discharge Summary   Patient: Tyler Mccoy MRN: [id number] DOB: 05-07-1961  Admit date:     01/02/2024  Discharge date: 01/08/2024  Discharge Physician: Charlean Congress  PCP: Wyn Heater, MD  Recommendations at discharge: Follow-up with nephrology. Follow-up with PCP.   Follow-up Information     Wyn Heater, MD. Schedule an appointment as soon as possible for a visit in 1 week(s).   Specialty: Family Medicine Why: with BMP lab to look at kidney and electrolytes, with CBC lab to look at blood counts Contact information: [address] [place] [address] [phone]         Baron Border, MD. Go to.   Specialties: Internal Medicine, Vascular Surgery Why: the Appointment As Scheduled,  Friday 8am, arrive 7:30 at [facility] [address] [phone] Contact information: [address] [place] [address] [phone]                Discharge Diagnoses: Principal Problem:   Acute renal failure [facility]) Active Problems:   Hyperlipidemia   Moderate persistent asthma without complication  Hospital Course: Patient with PMH of asthma as well as allergic rhinitis presents to the hospital with complaints of shortness of breath.  Also had some chest pain. Patient is a runner.  Was training for a race.  Parents few miles a day. Reports that on Thursday 5/22 he ran extensively.  Overnight he had some blood in the urine.  He felt tired on the Friday.  Started having pain in his back which he felt was likely due to him passing a kidney stone.  Went to see PCP.  Blood work was done which was unremarkable including lipid panel.  Serum creatinine back down was 1.26.  Was recommended to hydrate. Started with shortness of breath and went to see PCP again on 5/28 and had some chest pain and was recommended to go to hospital for concern for PE. Found to have severe AKI/ARF.  Nephrology consulted.  Underwent renal biopsy.  Assessment and Plan: Acute renal  failure. On presentation serum creatinine 12.30 and BUN 147. Renal function on 5/22 serum creatinine 1.06. Nephrology consulted Took a dose of NSAID, had some blood in the urine on 5/22.  Actively training for a race for last few months. Initiated on IV fluid without any significant improvement in serum creatinine or BUN. Currently no indication for HD. Underwent renal biopsy.  Results shows ATN. ANA positive.  RNP positive.  Warm antibodies in blood. Per nephrology will be discharged home with close follow-up outpatient.  Appreciate assistance.  Acute anemia. Hemoglobin on admission was 9.4. Hemoglobin dropping likely combination of dilution.  There is concern for hemolysis given presence of warm antibodies as well as concern for marrow suppression in the setting of renal failure. Receiving iron receiving EPO. Blood transfusion ordered although due to presence of antibody difficult to find a manage as of right now. Patient is minimally symptomatic for now. Continue to monitor H&H. Transfuse for hemoglobin less than 7 or hemodynamic instability. Appreciate discussion with hematology.  In an event if the patient requires a blood transfusion, pretreatment with Pepcid, Benadryl Tylenol  and steroid would be recommended for transfusion.  Mild hyperkalemia. Treated with IV fluid and Lokelma . Monitor.  Elevated troponin. Likely secondary to poor clearance. No evidence of demand ischemia. Monitor.  Left flank pain. Currently resolved. CT scan unremarkable. Monitor.  Acute asthma flareup. Bilateral expiratory wheezing on examination. Breathing improved with change in nebulizer therapy. Discussed with nephrology.  Okay to start steroids.  Will initiate at 50 mg oral prednisone.  Chest x-ray unremarkable.  Incidental pulmonary nodule. Repeat outpatient per PCP.   Consultants:  Nephrology IR  Procedures performed:  Renal biopsy Echocardiogram  DISCHARGE MEDICATION: Allergies  as of 01/08/2024       Reactions   Flomax [tamsulosin] Other (See Comments)   Headaches Nasal congestion   Amoxil [amoxicillin] Rash        Medication List     STOP taking these medications    Advil Dual Action 125-250 MG Tabs Generic drug: Ibuprofen-Acetaminophen    Multivitamin Men Tabs       TAKE these medications    azelastine 0.1 % nasal spray Commonly known as: ASTELIN Place 1-2 sprays into both nostrils 2 (two) times daily as needed for rhinitis or allergies.   B-complex with vitamin C tablet Take 1 tablet by mouth daily. Start taking on: January 09, 2024   benzonatate  100 MG capsule Commonly known as: TESSALON  Take 1 capsule (100 mg total) by mouth 3 (three) times daily.   budesonide -formoterol 80-4.5 MCG/ACT inhaler Commonly known as: SYMBICORT Inhale 2 puffs into the lungs 2 (two) times daily.   guaiFENesin  600 MG 12 hr tablet Commonly known as: MUCINEX  Take 1 tablet (600 mg total) by mouth 2 (two) times daily.   melatonin 3 MG Tabs tablet Take 1 tablet (3 mg total) by mouth at bedtime.   NEXIUM 24HR PO Take 1 capsule by mouth once.   OVER THE COUNTER MEDICATION Take 1 capsule by mouth daily. NatureMade CholestOff   PEPCID PO Take 1 tablet by mouth once.   polyethylene glycol 17 g packet Commonly known as: MIRALAX  / GLYCOLAX  Take 17 g by mouth daily as needed for mild constipation.   predniSONE 10 MG tablet Commonly known as: DELTASONE Take 30mg  daily for 1 days,Take 20mg  daily for 1 days,Take 10mg  daily for 1 days, then stop Start taking on: January 09, 2024   sodium bicarbonate  650 MG tablet Take 1 tablet (650 mg total) by mouth 2 (two) times daily.   TUMS PO Take 1 each by mouth daily as needed (upset stomach, nausea). Tums Gummy Bites "Upset Stomach and Nausea Support"   VITAMIN C PO Take 1 tablet by mouth daily.       Disposition: Home Diet recommendation: Renal diet  Discharge Exam: Vitals:   01/07/24 1701 01/07/24 2136  01/08/24 0534 01/08/24 0825  BP: (!) 168/81 (!) 165/91 (!) 166/85 138/71  Pulse: 63 69 67 75  Resp: 18 18 18 18   Temp: 98 F (36.7 C) 98.3 F (36.8 C) 98 F (36.7 C) 98.2 F (36.8 C)  TempSrc: Oral Oral Oral   SpO2: 97% 99% 99% 98%  Weight:      Height:       General: Appear in mild distress; no visible Abnormal Neck Mass Or lumps, Conjunctiva normal Cardiovascular: S1 and S2 Present, no Murmur, Respiratory: good respiratory effort, Bilateral Air entry present and CTA, no Crackles, no wheezes Abdomen: Bowel Sound present, Non tender  Extremities: no Pedal edema Neurology: alert and oriented to time, place, and person  [facility] Weights   01/02/24 2200 01/03/24 0424 01/05/24 0718  Weight: 62.4 kg 63.2 kg 63 kg   Condition at discharge: stable  The results of significant diagnostics from this hospitalization (including imaging, microbiology, ancillary and laboratory) are listed below for reference.   Imaging Studies: DG Chest 2 View Result Date: 01/07/2024 CLINICAL DATA:  [id number] Asthma exacerbation [id number], wheezing EXAM:  CHEST - 2 VIEW COMPARISON:  Jan 02, 2024 FINDINGS: No focal airspace consolidation, pleural effusion, or pneumothorax. No cardiomegaly. No acute fracture or destructive lesion. IMPRESSION: No acute cardiopulmonary abnormality. Electronically Signed   By: Rance Burrows M.D.   On: 01/07/2024 19:23   ECHOCARDIOGRAM COMPLETE Result Date: 01/07/2024    ECHOCARDIOGRAM REPORT   Patient Name:   TODD JELINSKI Date of Exam: 01/03/2024 Medical Rec #:  [id number]      Height:       65.0 in Accession #:    [id number]     Weight:       139.3 lb Date of Birth:  October 30, 1960      BSA:          1.696 m Patient Age:    63 years       BP:           135/82 mmHg Patient Gender: M              HR:           69 bpm. Exam Location:  Inpatient Procedure: 2D Echo, Cardiac Doppler and Color Doppler (Both Spectral and Color            Flow Doppler were utilized during procedure). Indications:    Dyspnea  R06.00  History:        Patient has no prior history of Echocardiogram examinations.  Sonographer:    Terrilee Few RCS Referring Phys: [phone] Bruna Capers PEEPLES IMPRESSIONS  1. Left ventricular ejection fraction, by estimation, is 60 to 65%. The left ventricle has normal function. The left ventricle has no regional wall motion abnormalities. Left ventricular diastolic parameters were normal. The average left ventricular global longitudinal strain is 26.0 %. The global longitudinal strain is normal.  2. Right ventricular systolic function is normal. The right ventricular size is normal.  3. The mitral valve is normal in structure. Trivial mitral valve regurgitation. No evidence of mitral stenosis.  4. The aortic valve is normal in structure. Aortic valve regurgitation is not visualized. No aortic stenosis is present.  5. The inferior vena cava is dilated in size with >50% respiratory variability, suggesting right atrial pressure of 8 mmHg. FINDINGS  Left Ventricle: Left ventricular ejection fraction, by estimation, is 60 to 65%. The left ventricle has normal function. The left ventricle has no regional wall motion abnormalities. The average left ventricular global longitudinal strain is 26.0 %. Strain was performed and the global longitudinal strain is normal. The left ventricular internal cavity size was normal in size. There is no left ventricular hypertrophy. Left ventricular diastolic parameters were normal. Right Ventricle: The right ventricular size is normal. No increase in right ventricular wall thickness. Right ventricular systolic function is normal. Left Atrium: Left atrial size was normal in size. Right Atrium: Right atrial size was normal in size. Pericardium: There is no evidence of pericardial effusion. Mitral Valve: The mitral valve is normal in structure. Trivial mitral valve regurgitation. No evidence of mitral valve stenosis. Tricuspid Valve: The tricuspid valve is normal in structure. Tricuspid  valve regurgitation is not demonstrated. No evidence of tricuspid stenosis. Aortic Valve: The aortic valve is normal in structure. Aortic valve regurgitation is not visualized. No aortic stenosis is present. Aortic valve peak gradient measures 14.1 mmHg. Pulmonic Valve: The pulmonic valve was normal in structure. Pulmonic valve regurgitation is not visualized. No evidence of pulmonic stenosis. Aorta: The aortic root is normal in size and structure. Venous: The inferior vena cava is dilated  in size with greater than 50% respiratory variability, suggesting right atrial pressure of 8 mmHg. IAS/Shunts: No atrial level shunt detected by color flow Doppler.  LEFT VENTRICLE PLAX 2D LVIDd:         4.60 cm   Diastology LVIDs:         2.70 cm   LV e' medial:    11.30 cm/s LV PW:         1.10 cm   LV E/e' medial:  8.2 LV IVS:        0.80 cm   LV e' lateral:   12.40 cm/s LVOT diam:     2.10 cm   LV E/e' lateral: 7.5 LV SV:         86 LV SV Index:   51        2D Longitudinal Strain LVOT Area:     3.46 cm  2D Strain GLS Avg:     26.0 %                           3D Volume EF:                          3D EF:        72 %                          LV EDV:       138 ml                          LV ESV:       39 ml                          LV SV:        99 ml RIGHT VENTRICLE             IVC RV S prime:     19.00 cm/s  IVC diam: 2.40 cm TAPSE (M-mode): 2.5 cm LEFT ATRIUM             Index        RIGHT ATRIUM           Index LA diam:        3.20 cm 1.89 cm/m   RA Area:     12.80 cm LA Vol (A2C):   54.0 ml 31.80 ml/m  RA Volume:   32.20 ml  18.98 ml/m LA Vol (A4C):   40.7 ml 23.99 ml/m LA Biplane Vol: 44.7 ml 26.35 ml/m  AORTIC VALVE AV Area (Vmax): 2.12 cm AV Vmax:        188.00 cm/s AV Peak Grad:   14.1 mmHg LVOT Vmax:      115.00 cm/s LVOT Vmean:     74.500 cm/s LVOT VTI:       0.248 m  AORTA Ao Root diam: 3.20 cm Ao Asc diam:  3.00 cm MITRAL VALVE MV Area (PHT): 3.21 cm    SHUNTS MV Decel Time: 236 msec    Systemic VTI:  0.25 m  MV E velocity: 92.90 cm/s  Systemic Diam: 2.10 cm MV A velocity: 72.50 cm/s MV E/A ratio:  1.28 Aditya Sabharwal Electronically signed by Alwin Baars Signature Date/Time: 01/07/2024/3:15:35 PM    Final    US  BIOPSY (KIDNEY) Result Date: 01/04/2024 INDICATION: Acute kidney injury  EXAM: Ultrasound-guided renal biopsy MEDICATIONS: None. ANESTHESIA/SEDATION: Moderate (conscious) sedation was employed during this procedure. A total of Versed  2 mg and Fentanyl  50 mcg was administered intravenously by the radiology nurse. Total intra-service moderate Sedation Time: 18 minutes. The patient's level of consciousness and vital signs were monitored continuously by radiology nursing throughout the procedure under my direct supervision. COMPLICATIONS: None immediate. PROCEDURE: Informed written consent was obtained from the patient after a thorough discussion of the procedural risks, benefits and alternatives. All questions were addressed. Maximal Sterile Barrier Technique was utilized including caps, mask, sterile gowns, sterile gloves, sterile drape, hand hygiene and skin antiseptic. A timeout was performed prior to the initiation of the procedure. With the patient in a prone position, both like regions for scanned with ultrasound in order to evaluate multiple target region within the best visualized kidney. The left kidney was chosen as the best visualized. The left flank was prepped and draped in usual sterile fashion. Local anesthesia was achieved with 1% lidocaine . Lidocaine  was administered by infiltrating the skin to the level of the renal capsule of under ultrasound guidance by bands in the needle and injected lidocaine . A small incision was made patient's flight and an introducer needle was advanced to the lower pole left kidney. Once in position of the needle was removed leaving the cannula behind for access to the left kidney. The bio Pince biopsy needle was then advanced under ultrasound guidance through the  cannula and into the lower pole. This was performed twice and 218 gauge cores were obtained of the renal parenchyma. Gel-Foam slurry was injected through the cannula the cannula was removed the patient. Sterile dressing was applied. Final image demonstrates no active hemorrhage. IMPRESSION: Successful core needle biopsy of the lower pole left kidney. Electronically Signed   By: Susan Ensign   On: 01/04/2024 12:47   CT CHEST ABDOMEN PELVIS WO CONTRAST Result Date: 01/02/2024 CLINICAL DATA:  Left flank pain.  History of kidney stones. EXAM: CT CHEST, ABDOMEN AND PELVIS WITHOUT CONTRAST TECHNIQUE: Multidetector CT imaging of the chest, abdomen and pelvis was performed following the standard protocol without IV contrast. RADIATION DOSE REDUCTION: This exam was performed according to the departmental dose-optimization program which includes automated exposure control, adjustment of the mA and/or kV according to patient size and/or use of iterative reconstruction technique. COMPARISON:  Chest CT dated 07/24/2023 and CT abdomen pelvis dated 08/22/2022. FINDINGS: Evaluation of this exam is limited in the absence of intravenous contrast. CT CHEST FINDINGS Cardiovascular: There is no cardiomegaly or pericardial effusion. The thoracic aorta and the central pulmonary arteries are grossly unremarkable on this noncontrast CT. Mediastinum/Nodes: No hilar or mediastinal adenopathy. The esophagus and the thyroid gland are grossly unremarkable. No mediastinal fluid collection. Lungs/Pleura: No focal consolidation, pleural effusion, pneumothorax. There is a 5 mm right middle lobe nodule (130/3). The central airways are patent. Musculoskeletal: No acute osseous pathology. CT ABDOMEN PELVIS FINDINGS No intra-abdominal free air or free fluid. Hepatobiliary: The liver is unremarkable. No acute tension. The gallbladder is unremarkable. Pancreas: Unremarkable. No pancreatic ductal dilatation or surrounding inflammatory changes.  Spleen: Normal in size without focal abnormality. Adrenals/Urinary Tract: The adrenal glands are unremarkable. The kidneys, visualized ureters, and urinary bladder appear unremarkable. Stomach/Bowel: There is no bowel obstruction or active inflammation. Mild sigmoid diverticulosis. The appendix is normal. Vascular/Lymphatic: Mild atherosclerotic calcification of the abdominal aorta. The IVC is unremarkable. No portal venous gas. There is no adenopathy. Reproductive: Mildly enlarged prostate gland measuring 5 cm in transverse axial diameter.  Other: None Musculoskeletal: No acute or significant osseous findings. IMPRESSION: 1. No acute intrathoracic, abdominal, or pelvic pathology. No hydronephrosis or nephrolithiasis. 2. A 5 mm right middle lobe nodule. No follow-up needed if patient is low-risk.This recommendation follows the consensus statement: Guidelines for Management of Incidental Pulmonary Nodules Detected on CT Images: From the Fleischner Society 2017; Radiology 2017; 284:228-243. 3. Mild sigmoid diverticulosis. No bowel obstruction. Normal appendix. 4.  Aortic Atherosclerosis (ICD10-I70.0). Electronically Signed   By: Angus Bark M.D.   On: 01/02/2024 17:48   DG Chest 2 View Result Date: 01/02/2024 CLINICAL DATA:  sob, labored breathing EXAM: CHEST - 2 VIEW COMPARISON:  October 25, 2017, July 24, 2023 FINDINGS: No focal airspace consolidation, pleural effusion, or pneumothorax. No cardiomegaly. No acute fracture or destructive lesion. Multilevel thoracic osteophytosis. IMPRESSION: No acute cardiopulmonary abnormality. Electronically Signed   By: Rance Burrows M.D.   On: 01/02/2024 16:44    Microbiology: Results for orders placed or performed during the hospital encounter of 01/02/24  Resp panel by RT-PCR (RSV, Flu A&B, Covid) Anterior Nasal Swab     Status: None   Collection Time: 01/02/24  2:47 PM   Specimen: Anterior Nasal Swab  Result Value Ref Range Status   SARS Coronavirus 2 by RT  PCR NEGATIVE NEGATIVE Final    Comment: (NOTE) SARS-CoV-2 target nucleic acids are NOT DETECTED.  The SARS-CoV-2 RNA is generally detectable in upper respiratory specimens during the acute phase of infection. The lowest concentration of SARS-CoV-2 viral copies this assay can detect is 138 copies/mL. A negative result does not preclude SARS-Cov-2 infection and should not be used as the sole basis for treatment or other patient management decisions. A negative result may occur with  improper specimen collection/handling, submission of specimen other than nasopharyngeal swab, presence of viral mutation(s) within the areas targeted by this assay, and inadequate number of viral copies(<138 copies/mL). A negative result must be combined with clinical observations, patient history, and epidemiological information. The expected result is Negative.  Fact Sheet for Patients:  BloggerCourse.com  Fact Sheet for Healthcare Providers:  SeriousBroker.it  This test is no t yet approved or cleared by the [place]  FDA and  has been authorized for detection and/or diagnosis of SARS-CoV-2 by FDA under an Emergency Use Authorization (EUA). This EUA will remain  in effect (meaning this test can be used) for the duration of the COVID-19 declaration under Section 564(b)(1) of the Act, 21 U.S.C.section 360bbb-3(b)(1), unless the authorization is terminated  or revoked sooner.       Influenza A by PCR NEGATIVE NEGATIVE Final   Influenza B by PCR NEGATIVE NEGATIVE Final    Comment: (NOTE) The Xpert Xpress SARS-CoV-2/FLU/RSV plus assay is intended as an aid in the diagnosis of influenza from Nasopharyngeal swab specimens and should not be used as a sole basis for treatment. Nasal washings and aspirates are unacceptable for Xpert Xpress SARS-CoV-2/FLU/RSV testing.  Fact Sheet for Patients: BloggerCourse.com  Fact Sheet for  Healthcare Providers: SeriousBroker.it  This test is not yet approved or cleared by the [place]  FDA and has been authorized for detection and/or diagnosis of SARS-CoV-2 by FDA under an Emergency Use Authorization (EUA). This EUA will remain in effect (meaning this test can be used) for the duration of the COVID-19 declaration under Section 564(b)(1) of the Act, 21 U.S.C. section 360bbb-3(b)(1), unless the authorization is terminated or revoked.     Resp Syncytial Virus by PCR NEGATIVE NEGATIVE Final    Comment: (NOTE) Fact Sheet  for Patients: BloggerCourse.com  Fact Sheet for Healthcare Providers: SeriousBroker.it  This test is not yet approved or cleared by the [place]  FDA and has been authorized for detection and/or diagnosis of SARS-CoV-2 by FDA under an Emergency Use Authorization (EUA). This EUA will remain in effect (meaning this test can be used) for the duration of the COVID-19 declaration under Section 564(b)(1) of the Act, 21 U.S.C. section 360bbb-3(b)(1), unless the authorization is terminated or revoked.  Performed at [facility], [address] [place], [place] [address]   Respiratory (~20 pathogens) panel by PCR     Status: None   Collection Time: 01/03/24  6:42 PM   Specimen: Nasopharyngeal Swab; Respiratory  Result Value Ref Range Status   Adenovirus NOT DETECTED NOT DETECTED Final   Coronavirus 229E NOT DETECTED NOT DETECTED Final    Comment: (NOTE) The Coronavirus on the Respiratory Panel, DOES NOT test for the novel  Coronavirus (2019 nCoV)    Coronavirus HKU1 NOT DETECTED NOT DETECTED Final   Coronavirus NL63 NOT DETECTED NOT DETECTED Final   Coronavirus OC43 NOT DETECTED NOT DETECTED Final   Metapneumovirus NOT DETECTED NOT DETECTED Final   Rhinovirus / Enterovirus NOT DETECTED NOT DETECTED Final   Influenza A NOT DETECTED NOT DETECTED Final    Influenza B NOT DETECTED NOT DETECTED Final   Parainfluenza Virus 1 NOT DETECTED NOT DETECTED Final   Parainfluenza Virus 2 NOT DETECTED NOT DETECTED Final   Parainfluenza Virus 3 NOT DETECTED NOT DETECTED Final   Parainfluenza Virus 4 NOT DETECTED NOT DETECTED Final   Respiratory Syncytial Virus NOT DETECTED NOT DETECTED Final   Bordetella pertussis NOT DETECTED NOT DETECTED Final   Bordetella Parapertussis NOT DETECTED NOT DETECTED Final   Chlamydophila pneumoniae NOT DETECTED NOT DETECTED Final   Mycoplasma pneumoniae NOT DETECTED NOT DETECTED Final    Comment: Performed at [facility], [address], [place], [place] [address]   Labs: CBC: Recent Labs  Lab 01/05/24 [id number] 01/05/24 1241 01/05/24 2148 01/06/24 0733 01/06/24 1704 01/07/24 0446 01/08/24 0451  WBC 8.9 8.3 8.0 7.3 7.7 7.9 10.3  NEUTROABS 5.4 5.3 5.0 4.4 4.9  --   --   HGB 6.8* 8.0* 7.2* 7.6* 7.7* 7.0* 8.0*  HCT 20.3* 23.6* 20.9* 22.8* 22.9* 21.5* 23.8*  MCV 95.3 95.9 94.6 96.6 97.0 97.3 96.4  PLT 285 351 322 361 373 430* 471*   Basic Metabolic Panel: Recent Labs  Lab 01/04/24 0549 01/05/24 0317 01/06/24 0733 01/07/24 0446 01/08/24 0451  NA 134* 131* 134* 136 137  K 4.4 4.6 4.5 4.5 4.8  CL 104 100 101 104 104  CO2 18* 19* 17* 20* 22  GLUCOSE 108* 109* 102* 104* 145*  BUN 135* 121* 118* 110* 101*  CREATININE 12.99* 13.08* 13.37* 13.27* 13.13*  CALCIUM 8.0* 7.4* 7.9* 7.7* 7.8*  MG 2.4 2.1 2.1 2.1 2.0  PHOS 7.6*  --   --  7.7* 7.5*   Liver Function Tests: Recent Labs  Lab 01/02/24 1530 01/03/24 0610 01/04/24 0549 01/05/24 0317 01/05/24 1241 01/06/24 0733 01/07/24 0446 01/08/24 0451  AST 40 24 21 14*  --  15  --   --   ALT 62* 46* 34 27  --  21  --   --   ALKPHOS 102 67 56 54  --  63  --   --   BILITOT 0.6 0.3 0.5 0.7 0.6 0.8  --   --   PROT 7.3 6.6 5.7* 5.5*  --  6.3*  --   --   ALBUMIN 4.0 3.5 3.1* 2.9*  --  3.3* 2.9* 3.1*   CBG: No results for input(s): "GLUCAP" in the last 168  hours.  Discharge time spent: greater than 30 minutes.  Author: Charlean Congress, MD  [facility] 01/08/2024

## 2024-01-08 NOTE — Progress Notes (Signed)
 [facility] Progress Note   Assessment/ Plan:   Assessment/Recommendations: Tyler Mccoy is a/an 63 y.o. male with a past medical history silent reflux with reactive airway disease who present w/ pulmonary wheezing and acute renal failure   Acute Renal Failure: Extensive AKI with normal creatinine about a week ago.  First concern would be dehydration in the setting of recent GI illness as well as NSAID use ultimately leading to tubular injury.  However his history has several clues that are concerning.  Pulmonary disease is somewhat strange and could represent vasculitis.  Also elevated proBNP as well as anemia are concerning and not fully explained. 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  On steroids now per primary   Anemia: Unclear cause.  No blood loss per patient but iron sat 11%.   Has antibodies- hemolysis workup in process but doesn't seem to be overwhelmingly positive. Receiving ESA and iron.  Will f/u outpt - pt aware may need ongoing w/u for this if not improving.    Elevated proBNP: fairly elevated for an otherwise healthy gentleman. TTE normal.   Metabolic acidosis: mild, CTM, on po bicarb - continue at d/c  [facility] to d/c from my perspective, has f/u with me on Friday 8am, arrive 7:30 at [facility] [address] [phone].    Subjective:    Hgb holding above transfusion threshold and up to 8 today UOP 3.4L yest Taking good PO  intake    Objective:   BP 138/71   Pulse 75   Temp 98.2 F (36.8 C)   Resp 18   Ht 5\' 5"  (1.651 m)   Wt 63 kg   SpO2 98%   BMI 23.11 kg/m   Intake/Output Summary (Last 24 hours) at 01/08/2024 8295 Last data filed at 01/08/2024 0532 Gross per 24 hour  Intake 928.93 ml  Output 3025 ml  Net -2096.07 ml   Weight change:   Physical  Exam: Gen:NAD CVS: EOMI PERRL Resp: clear Abd: soft Ext: no LE edema  Imaging: DG Chest 2 View Result Date: 01/07/2024 CLINICAL DATA:  [id number] Asthma exacerbation [id number], wheezing EXAM: CHEST - 2 VIEW COMPARISON:  Jan 02, 2024 FINDINGS: No focal airspace consolidation, pleural effusion, or pneumothorax. No cardiomegaly. No acute fracture or destructive lesion. IMPRESSION: No acute cardiopulmonary abnormality. Electronically Signed   By: Rance Burrows M.D.   On: 01/07/2024 19:23     Labs: BMET Recent Labs  Lab 01/02/24 1447 01/02/24 1731 01/03/24 0610 01/04/24 0549 01/05/24 0317 01/06/24 0733 01/07/24 0446 01/08/24 0451  NA 135 135 136 134* 131* 134* 136 137  K 5.6* 5.3* 4.8 4.4 4.6 4.5 4.5 4.8  CL 98  --  107 104 100 101 104 104  CO2 15*  --  18* 18* 19* 17* 20* 22  GLUCOSE 114*  --  112* 108* 109* 102* 104* 145*  BUN 147*  --  145* 135* 121* 118* 110* 101*  CREATININE 12.30*  --  12.80* 12.99* 13.08* 13.37* 13.27* 13.13*  CALCIUM 9.6  --  8.8* 8.0* 7.4* 7.9* 7.7* 7.8*  PHOS  --   --   --  7.6*  --   --  7.7* 7.5*   CBC Recent Labs  Lab 01/05/24 1241 01/05/24 2148 01/06/24 9629 01/06/24 1704 01/07/24 0446 01/08/24 0451  WBC 8.3 8.0 7.3 7.7 7.9 10.3  NEUTROABS 5.3 5.0 4.4 4.9  --   --   HGB 8.0* 7.2* 7.6* 7.7* 7.0* 8.0*  HCT 23.6* 20.9* 22.8* 22.9* 21.5* 23.8*  MCV 95.9 94.6 96.6 97.0 97.3 96.4  PLT 351 322 361 373 430* 471*    Medications:     sodium chloride    Intravenous Once   arformoterol   15 mcg Nebulization BID   B-complex with vitamin C  1 tablet Oral Daily   benzonatate   100 mg Oral TID   budesonide  (PULMICORT ) nebulizer solution  0.25 mg Nebulization BID   darbepoetin (ARANESP) injection - NON-DIALYSIS  60 mcg Subcutaneous Q Sat-1800   fluticasone  furoate-vilanterol  1 puff Inhalation Daily   guaiFENesin   600 mg Oral BID   melatonin  3 mg Oral QHS   predniSONE  50 mg Oral Q breakfast   sodium bicarbonate   1,300 mg Oral TID

## 2024-01-09 ENCOUNTER — Encounter (HOSPITAL_COMMUNITY): Payer: Self-pay

## 2024-01-09 LAB — BPAM RBC
Blood Product Expiration Date: 202507022359
Blood Product Expiration Date: 202507052359
Unit Type and Rh: 202507022359
Unit Type and Rh: 5100
Unit Type and Rh: 5100

## 2024-01-09 LAB — TYPE AND SCREEN
ABO/RH(D): O POS
Antibody Screen: POSITIVE
DAT, IgG: POSITIVE
DAT, complement: POSITIVE
Donor AG Type: NEGATIVE
Unit division: 0
Unit division: 0
Unit division: 0

## 2024-01-09 LAB — COMPLEMENT, TOTAL: Compl, Total (CH50): >60 U/mL (ref >41)

## 2024-01-10 LAB — SURGICAL PATHOLOGY

## 2024-03-07 ENCOUNTER — Other Ambulatory Visit: Payer: Self-pay | Admitting: Urology

## 2024-03-07 DIAGNOSIS — R972 Elevated prostate specific antigen [PSA]: Secondary | ICD-10-CM

## 2024-03-10 ENCOUNTER — Encounter: Payer: Self-pay | Admitting: Urology

## 2024-05-06 ENCOUNTER — Ambulatory Visit
Admission: RE | Admit: 2024-05-06 | Discharge: 2024-05-06 | Disposition: A | Source: Ambulatory Visit | Attending: Urology | Admitting: Urology

## 2024-05-06 DIAGNOSIS — R972 Elevated prostate specific antigen [PSA]: Secondary | ICD-10-CM

## 2024-05-06 MED ORDER — GADOPICLENOL 0.5 MMOL/ML IV SOLN
7.5000 mL | Freq: Once | INTRAVENOUS | Status: AC | PRN
Start: 2024-05-06 — End: 2024-05-06
  Administered 2024-05-06: 7.5 mL via INTRAVENOUS
